# Patient Record
Sex: Female | Born: 1980 | Race: Black or African American | Hispanic: No | Marital: Single | Smoking: Current every day smoker
Health system: Southern US, Community
[De-identification: ages and names within clinical notes are randomized; demographics above are authoritative.]

## PROBLEM LIST (undated history)

## (undated) DIAGNOSIS — E119 Type 2 diabetes mellitus without complications: Secondary | ICD-10-CM

## (undated) DIAGNOSIS — F329 Major depressive disorder, single episode, unspecified: Secondary | ICD-10-CM

## (undated) DIAGNOSIS — F32A Depression, unspecified: Secondary | ICD-10-CM

## (undated) DIAGNOSIS — M7989 Other specified soft tissue disorders: Secondary | ICD-10-CM

## (undated) DIAGNOSIS — J45909 Unspecified asthma, uncomplicated: Secondary | ICD-10-CM

## (undated) DIAGNOSIS — I10 Essential (primary) hypertension: Secondary | ICD-10-CM

---

## 2012-08-10 ENCOUNTER — Encounter (HOSPITAL_COMMUNITY): Payer: Self-pay | Admitting: Emergency Medicine

## 2012-08-10 ENCOUNTER — Emergency Department (HOSPITAL_COMMUNITY)
Admission: EM | Admit: 2012-08-10 | Discharge: 2012-08-10 | Disposition: A | Discharge status: Home / Self Care | Payer: Medicaid Other | Attending: Emergency Medicine | Admitting: Emergency Medicine

## 2012-08-10 DIAGNOSIS — R110 Nausea: Secondary | ICD-10-CM | POA: Insufficient documentation

## 2012-08-10 DIAGNOSIS — E119 Type 2 diabetes mellitus without complications: Secondary | ICD-10-CM | POA: Insufficient documentation

## 2012-08-10 DIAGNOSIS — Z880 Allergy status to penicillin: Secondary | ICD-10-CM | POA: Insufficient documentation

## 2012-08-10 DIAGNOSIS — K921 Melena: Secondary | ICD-10-CM | POA: Insufficient documentation

## 2012-08-10 DIAGNOSIS — F329 Major depressive disorder, single episode, unspecified: Secondary | ICD-10-CM | POA: Insufficient documentation

## 2012-08-10 DIAGNOSIS — Z794 Long term (current) use of insulin: Secondary | ICD-10-CM | POA: Insufficient documentation

## 2012-08-10 DIAGNOSIS — J45909 Unspecified asthma, uncomplicated: Secondary | ICD-10-CM | POA: Insufficient documentation

## 2012-08-10 DIAGNOSIS — R109 Unspecified abdominal pain: Secondary | ICD-10-CM | POA: Insufficient documentation

## 2012-08-10 DIAGNOSIS — I10 Essential (primary) hypertension: Secondary | ICD-10-CM | POA: Insufficient documentation

## 2012-08-10 DIAGNOSIS — Z3202 Encounter for pregnancy test, result negative: Secondary | ICD-10-CM | POA: Insufficient documentation

## 2012-08-10 DIAGNOSIS — Z79899 Other long term (current) drug therapy: Secondary | ICD-10-CM | POA: Insufficient documentation

## 2012-08-10 DIAGNOSIS — F3289 Other specified depressive episodes: Secondary | ICD-10-CM | POA: Insufficient documentation

## 2012-08-10 DIAGNOSIS — K625 Hemorrhage of anus and rectum: Secondary | ICD-10-CM | POA: Insufficient documentation

## 2012-08-10 DIAGNOSIS — F17200 Nicotine dependence, unspecified, uncomplicated: Secondary | ICD-10-CM | POA: Insufficient documentation

## 2012-08-10 DIAGNOSIS — G8929 Other chronic pain: Secondary | ICD-10-CM | POA: Insufficient documentation

## 2012-08-10 HISTORY — DX: Major depressive disorder, single episode, unspecified: F32.9

## 2012-08-10 HISTORY — DX: Unspecified asthma, uncomplicated: J45.909

## 2012-08-10 HISTORY — DX: Type 2 diabetes mellitus without complications: E11.9

## 2012-08-10 HISTORY — DX: Depression, unspecified: F32.A

## 2012-08-10 HISTORY — DX: Essential (primary) hypertension: I10

## 2012-08-10 LAB — URINALYSIS, ROUTINE W REFLEX MICROSCOPIC
Glucose, UA: >1000 mg/dL — AB
Hgb urine dipstick: NEGATIVE
Ketones, ur: NEGATIVE mg/dL
pH: 5.5 (ref 5.0–8.0)

## 2012-08-10 LAB — COMPREHENSIVE METABOLIC PANEL
ALT: 14 U/L (ref 0–35)
AST: 16 U/L (ref 0–37)
Albumin: 3.7 g/dL (ref 3.5–5.2)
CO2: 25 mEq/L (ref 19–32)
Chloride: 97 mEq/L (ref 96–112)
Creatinine, Ser: 0.49 mg/dL — ABNORMAL LOW (ref 0.50–1.10)
GFR calc non Af Amer: >90 mL/min (ref >90)
Sodium: 133 mEq/L — ABNORMAL LOW (ref 135–145)
Total Bilirubin: 0.1 mg/dL — ABNORMAL LOW (ref 0.3–1.2)

## 2012-08-10 LAB — CBC WITH DIFFERENTIAL/PLATELET
Basophils Absolute: 0 10*3/uL (ref 0.0–0.1)
Basophils Relative: 0 % (ref 0–1)
HCT: 35.4 % — ABNORMAL LOW (ref 36.0–46.0)
Lymphocytes Relative: 39 % (ref 12–46)
MCHC: 34.7 g/dL (ref 30.0–36.0)
Monocytes Absolute: 0.7 10*3/uL (ref 0.1–1.0)
Neutro Abs: 5.7 10*3/uL (ref 1.7–7.7)
Neutrophils Relative %: 53 % (ref 43–77)
Platelets: 338 10*3/uL (ref 150–400)
RDW: 12.6 % (ref 11.5–15.5)
WBC: 10.6 10*3/uL — ABNORMAL HIGH (ref 4.0–10.5)

## 2012-08-10 LAB — URINE MICROSCOPIC-ADD ON

## 2012-08-10 LAB — GLUCOSE, CAPILLARY: Glucose-Capillary: 249 mg/dL — ABNORMAL HIGH (ref 70–99)

## 2012-08-10 MED ORDER — PANTOPRAZOLE SODIUM 20 MG PO TBEC: 20.0000 mg | DELAYED_RELEASE_TABLET | Freq: Every day | ORAL | Status: AC

## 2012-08-10 MED ORDER — PANTOPRAZOLE SODIUM 40 MG IV SOLR
40.0000 mg | Freq: Once | INTRAVENOUS | Status: AC
  Administered 2012-08-10: 40 mg via INTRAVENOUS
  Filled 2012-08-10: qty 40

## 2012-08-10 NOTE — ED Notes (Signed)
Pt c/o mid abd pain x months with nausea and some rectal bleeding

## 2012-08-10 NOTE — ED Notes (Signed)
Pt unable to urinate at this time.  

## 2012-08-10 NOTE — ED Provider Notes (Signed)
Patient complains of hypogastric pain for 2 years, daily. She states that everything she eats," goes through me"meaning that she has a bowel movement immediately after eating. She reports blood per rectum daily for the past several years. She reports she's had prior evaluation in [facility] for same complaint. "They could not find a reason" on exam alert nontoxic lungs clear auscultation heart regular rate and rhythm abdomen obese normal active bowel sounds nontender  Doug Sou, MD 08/11/12 0151

## 2012-08-10 NOTE — ED Provider Notes (Signed)
History    CSN: [id number] Arrival date & time 08/10/12  1819  First MD Initiated Contact with Patient 08/10/12 1907     Chief Complaint  Patient presents with  . Abdominal Pain  . Nausea  . Rectal Bleeding   (Consider location/radiation/quality/duration/timing/severity/associated sxs/prior Treatment) HPI Pt is a 32yo female with several year hx of intermittent abdominal pain stating "I can't keep anything down and no one can figure out why." Pt states every time she has eats she has a bowel movement immediately after.  Today she is presenting today with increased abdominal that is constant, aching and throbbing 9/10, associated with nausea and rectal bleeding.  States she noticed bright red blood after sitting on the toilet for 10-42min.  Blood was on stool and tissue paper but states it did not fill the toilet bowl.  Pt has not tried anything for pain.  Pt also reports being diabetic and has difficulty controlling her BG, currently taking novolog, lantus, and januvia.  States she does not regularly check her BG however has been in the 300s and 400s which is why she is now on 3 medications for DM.  Significant abd hx: reports tubal ligation and cyst removal.  Denies fever or vomiting. Last ate around 1600 today.   Past Medical History  Diagnosis Date  . Hypertension   . Diabetes mellitus without complication   . Asthma   . Depression    History reviewed. No pertinent past surgical history. History reviewed. No pertinent family history. History  Substance Use Topics  . Smoking status: Current Every Day Smoker  . Smokeless tobacco: Not on file  . Alcohol Use: Yes     Comment: occ   OB History   Grav Para Term Preterm Abortions TAB SAB Ect Mult Living                 Review of Systems  Constitutional: Negative for fever, chills, fatigue and unexpected weight change.  Gastrointestinal: Positive for nausea, abdominal pain, blood in stool and anal bleeding. Negative for vomiting,  diarrhea, constipation, abdominal distention and rectal pain.  Genitourinary: Negative for dysuria, hematuria and flank pain.  All other systems reviewed and are negative.    Allergies  Shrimp; Ibuprofen; Morphine and related; Penicillins; Strawberry; Tramadol; and Vicodin  Home Medications   Current Outpatient Rx  Name  Route  Sig  Dispense  Refill  . citalopram (CELEXA) 20 MG tablet   Oral   Take 20 mg by mouth daily.         . insulin aspart (NOVOLOG FLEXPEN) 100 UNIT/ML SOPN FlexPen   Subcutaneous   Inject 10 Units into the skin 3 (three) times daily with meals. If CBG is above 200, take additional 10 units-per MD.         . insulin glargine (LANTUS) 100 UNIT/ML injection   Subcutaneous   Inject 80-100 Units into the skin 2 (two) times daily. Takes 80 units in AM, then takes 100 units in PM every day.         . lisinopril-hydrochlorothiazide (PRINZIDE,ZESTORETIC) 20-12.5 MG per tablet   Oral   Take 1 tablet by mouth 2 (two) times daily.         . sertraline (ZOLOFT) 50 MG tablet   Oral   Take 50 mg by mouth daily.         . sitaGLIPtin (JANUVIA) 100 MG tablet   Oral   Take 100 mg by mouth daily.         Marland Kitchen  valACYclovir (VALTREX) 500 MG tablet   Oral   Take 500 mg by mouth daily.         . pantoprazole (PROTONIX) 20 MG tablet   Oral   Take 1 tablet (20 mg total) by mouth daily.   14 tablet   0    BP 123/74  Pulse 83  Temp(Src) 98.7 F (37.1 C) (Oral)  Resp 16  SpO2 99% Physical Exam  Nursing note and vitals reviewed. Constitutional: She appears well-developed and well-nourished. No distress.  HENT:  Head: Normocephalic and atraumatic.  Eyes: Conjunctivae are normal. No scleral icterus.  Neck: Normal range of motion. Neck supple.  Cardiovascular: Normal rate, regular rhythm and normal heart sounds.   Pulmonary/Chest: Effort normal and breath sounds normal. No respiratory distress. She has no wheezes. She has no rales. She exhibits no  tenderness.  Abdominal: Soft. Bowel sounds are normal. She exhibits no distension and no mass. There is tenderness ( mild, diffuse). There is no rebound and no guarding.  Musculoskeletal: Normal range of motion.  Neurological: She is alert.  Skin: Skin is dry. She is not diaphoretic.    ED Course  Procedures (including critical care time) Labs Reviewed  CBC WITH DIFFERENTIAL - Abnormal; Notable for the following:    WBC 10.6 (*)    HCT 35.4 (*)    Lymphs Abs 4.1 (*)    All other components within normal limits  COMPREHENSIVE METABOLIC PANEL - Abnormal; Notable for the following:    Sodium 133 (*)    Glucose, Bld 271 (*)    Creatinine, Ser 0.49 (*)    Total Bilirubin 0.1 (*)    All other components within normal limits  URINALYSIS, ROUTINE W REFLEX MICROSCOPIC - Abnormal; Notable for the following:    APPearance CLOUDY (*)    Specific Gravity, Urine 1.034 (*)    Glucose, UA >1000 (*)    Leukocytes, UA TRACE (*)    All other components within normal limits  GLUCOSE, CAPILLARY - Abnormal; Notable for the following:    Glucose-Capillary 249 (*)    All other components within normal limits  URINE MICROSCOPIC-ADD ON - Abnormal; Notable for the following:    Squamous Epithelial / LPF MANY (*)    Bacteria, UA MANY (*)    All other components within normal limits  LIPASE, BLOOD  OCCULT BLOOD X 1 CARD TO LAB, STOOL  POCT PREGNANCY, URINE  OCCULT BLOOD, POC DEVICE   No results found. 1. Chronic abdominal pain     MDM  DDx: diverticulitis, gastroenteritis, hemorrhoids, IBS   Discussed pt with Dr. Ethelda Chick who does not believe imaging is needed at this time due to chronic nature of pain as well as pt's report of "extensive workup" without answers for patient's pain.    Rx: protonix. Will discharge pt home and have her f/u with [facility] info provided. Pt is also to call [facility] to f/u for ongoing abdominal pain. Return precautions given. Pt  verbalized understanding and agreement with tx plan. Vitals: unremarkable. Discharged in stable condition.    Discussed pt with attending during ED encounter.   Junius Finner, PA-C 08/10/12 2144

## 2012-08-11 NOTE — ED Provider Notes (Signed)
Medical screening examination/treatment/procedure(s) were conducted as a shared visit with non-physician practitioner(s) and myself.  I personally evaluated the patient during the encounter  Doug Sou, MD 08/11/12 [phone]

## 2012-08-12 LAB — URINE CULTURE

## 2012-09-22 ENCOUNTER — Encounter (HOSPITAL_COMMUNITY): Payer: Self-pay

## 2012-09-22 ENCOUNTER — Emergency Department (INDEPENDENT_AMBULATORY_CARE_PROVIDER_SITE_OTHER)
Admission: EM | Admit: 2012-09-22 | Discharge: 2012-09-22 | Disposition: A | Discharge status: Home / Self Care | Payer: Medicaid Other | Source: Home / Self Care | Attending: Family Medicine | Admitting: Family Medicine

## 2012-09-22 DIAGNOSIS — K2950 Unspecified chronic gastritis without bleeding: Secondary | ICD-10-CM

## 2012-09-22 DIAGNOSIS — K2940 Chronic atrophic gastritis without bleeding: Secondary | ICD-10-CM

## 2012-09-22 MED ORDER — RANITIDINE HCL 150 MG PO TABS: 150.0000 mg | ORAL_TABLET | Freq: Two times a day (BID) | ORAL | Status: DC

## 2012-09-22 MED ORDER — GI COCKTAIL ~~LOC~~
ORAL | Status: AC
  Filled 2012-09-22: qty 30

## 2012-09-22 MED ORDER — GI COCKTAIL ~~LOC~~
30.0000 mL | Freq: Once | ORAL | Status: AC
  Administered 2012-09-22: 30 mL via ORAL

## 2012-09-22 MED ORDER — ONDANSETRON HCL 4 MG PO TABS: 4.0000 mg | ORAL_TABLET | Freq: Four times a day (QID) | ORAL | Status: DC

## 2012-09-22 NOTE — ED Notes (Signed)
MD eval only for GERD type syx

## 2012-09-22 NOTE — ED Provider Notes (Signed)
CSN: [id number]     Arrival date & time 09/22/12  1850 History     First MD Initiated Contact with Patient 09/22/12 1949     No chief complaint on file.  (Consider location/radiation/quality/duration/timing/severity/associated sxs/prior Treatment) Patient is a 32 y.o. female presenting with abdominal pain. The history is provided by the patient.  Abdominal Pain This is a chronic problem. Episode onset: long time. The problem has not changed since onset.Associated symptoms include abdominal pain. Associated symptoms comments: Admits to lot of stress..    Past Medical History  Diagnosis Date  . Hypertension   . Diabetes mellitus without complication   . Asthma   . Depression    No past surgical history on file. No family history on file. History  Substance Use Topics  . Smoking status: Current Every Day Smoker  . Smokeless tobacco: Not on file  . Alcohol Use: Yes     Comment: occ   OB History   Grav Para Term Preterm Abortions TAB SAB Ect Mult Living                 Review of Systems  Constitutional: Positive for appetite change. Negative for fever and chills.  Gastrointestinal: Positive for nausea and abdominal pain. Negative for vomiting, diarrhea and constipation.  Genitourinary: Negative.  Negative for urgency, menstrual problem and pelvic pain.    Allergies  Shrimp; Ibuprofen; Morphine and related; Penicillins; Strawberry; Tramadol; and Vicodin  Home Medications   Current Outpatient Rx  Name  Route  Sig  Dispense  Refill  . citalopram (CELEXA) 20 MG tablet   Oral   Take 20 mg by mouth daily.         . insulin aspart (NOVOLOG FLEXPEN) 100 UNIT/ML SOPN FlexPen   Subcutaneous   Inject 10 Units into the skin 3 (three) times daily with meals. If CBG is above 200, take additional 10 units-per MD.         . insulin glargine (LANTUS) 100 UNIT/ML injection   Subcutaneous   Inject 80-100 Units into the skin 2 (two) times daily. Takes 80 units in AM, then takes  100 units in PM every day.         . lisinopril-hydrochlorothiazide (PRINZIDE,ZESTORETIC) 20-12.5 MG per tablet   Oral   Take 1 tablet by mouth 2 (two) times daily.         . ondansetron (ZOFRAN) 4 MG tablet   Oral   Take 1 tablet (4 mg total) by mouth every 6 (six) hours. As needed for n/v   12 tablet   0   . pantoprazole (PROTONIX) 20 MG tablet   Oral   Take 1 tablet (20 mg total) by mouth daily.   14 tablet   0   . ranitidine (ZANTAC) 150 MG tablet   Oral   Take 1 tablet (150 mg total) by mouth 2 (two) times daily.   60 tablet   0   . sertraline (ZOLOFT) 50 MG tablet   Oral   Take 50 mg by mouth daily.         . sitaGLIPtin (JANUVIA) 100 MG tablet   Oral   Take 100 mg by mouth daily.         . valACYclovir (VALTREX) 500 MG tablet   Oral   Take 500 mg by mouth daily.          BP 145/84  Pulse 95  Temp(Src) 98.2 F (36.8 C) (Oral)  Resp 16  SpO2 100% Physical  Exam  Nursing note and vitals reviewed. Constitutional: She is oriented to person, place, and time. She appears well-developed and well-nourished.  Neck: Neck supple.  Abdominal: Soft. Bowel sounds are normal. She exhibits no distension and no mass. There is no tenderness. There is no rebound and no guarding.  Neurological: She is alert and oriented to person, place, and time.  Skin: Skin is warm and dry.    ED Course   Procedures (including critical care time)  Labs Reviewed - No data to display No results found. 1. Gastritis, chronic     MDM    Linna Hoff, MD 09/22/12 2007

## 2012-10-04 ENCOUNTER — Encounter (HOSPITAL_COMMUNITY): Payer: Self-pay | Admitting: *Deleted

## 2012-10-04 ENCOUNTER — Emergency Department (HOSPITAL_COMMUNITY)
Admission: EM | Admit: 2012-10-04 | Discharge: 2012-10-04 | Disposition: A | Discharge status: Home / Self Care | Payer: Medicaid Other | Attending: Emergency Medicine | Admitting: Emergency Medicine

## 2012-10-04 DIAGNOSIS — Z794 Long term (current) use of insulin: Secondary | ICD-10-CM | POA: Insufficient documentation

## 2012-10-04 DIAGNOSIS — F3289 Other specified depressive episodes: Secondary | ICD-10-CM | POA: Insufficient documentation

## 2012-10-04 DIAGNOSIS — M7989 Other specified soft tissue disorders: Secondary | ICD-10-CM | POA: Insufficient documentation

## 2012-10-04 DIAGNOSIS — G8929 Other chronic pain: Secondary | ICD-10-CM

## 2012-10-04 DIAGNOSIS — J45909 Unspecified asthma, uncomplicated: Secondary | ICD-10-CM | POA: Insufficient documentation

## 2012-10-04 DIAGNOSIS — Z880 Allergy status to penicillin: Secondary | ICD-10-CM | POA: Insufficient documentation

## 2012-10-04 DIAGNOSIS — R109 Unspecified abdominal pain: Secondary | ICD-10-CM | POA: Insufficient documentation

## 2012-10-04 DIAGNOSIS — I10 Essential (primary) hypertension: Secondary | ICD-10-CM | POA: Insufficient documentation

## 2012-10-04 DIAGNOSIS — R197 Diarrhea, unspecified: Secondary | ICD-10-CM | POA: Insufficient documentation

## 2012-10-04 DIAGNOSIS — R509 Fever, unspecified: Secondary | ICD-10-CM | POA: Insufficient documentation

## 2012-10-04 DIAGNOSIS — R220 Localized swelling, mass and lump, head: Secondary | ICD-10-CM | POA: Insufficient documentation

## 2012-10-04 DIAGNOSIS — E119 Type 2 diabetes mellitus without complications: Secondary | ICD-10-CM | POA: Insufficient documentation

## 2012-10-04 DIAGNOSIS — F329 Major depressive disorder, single episode, unspecified: Secondary | ICD-10-CM | POA: Insufficient documentation

## 2012-10-04 DIAGNOSIS — Z3202 Encounter for pregnancy test, result negative: Secondary | ICD-10-CM | POA: Insufficient documentation

## 2012-10-04 DIAGNOSIS — Z79899 Other long term (current) drug therapy: Secondary | ICD-10-CM | POA: Insufficient documentation

## 2012-10-04 DIAGNOSIS — F17200 Nicotine dependence, unspecified, uncomplicated: Secondary | ICD-10-CM | POA: Insufficient documentation

## 2012-10-04 LAB — CBC WITH DIFFERENTIAL/PLATELET
Basophils Absolute: 0 K/uL (ref 0.0–0.1)
Basophils Relative: 0 % (ref 0–1)
Eosinophils Absolute: 0.2 K/uL (ref 0.0–0.7)
Eosinophils Relative: 3 % (ref 0–5)
HCT: 35.8 % — ABNORMAL LOW (ref 36.0–46.0)
Hemoglobin: 12.6 g/dL (ref 12.0–15.0)
Lymphocytes Relative: 39 % (ref 12–46)
Lymphs Abs: 3.6 K/uL (ref 0.7–4.0)
MCH: 28.8 pg (ref 26.0–34.0)
MCHC: 35.2 g/dL (ref 30.0–36.0)
MCV: 81.7 fL (ref 78.0–100.0)
Monocytes Absolute: 0.6 K/uL (ref 0.1–1.0)
Monocytes Relative: 7 % (ref 3–12)
Neutro Abs: 4.7 K/uL (ref 1.7–7.7)
Neutrophils Relative %: 51 % (ref 43–77)
Platelets: 309 K/uL (ref 150–400)
RBC: 4.38 MIL/uL (ref 3.87–5.11)
RDW: 12.9 % (ref 11.5–15.5)
WBC: 9.2 K/uL (ref 4.0–10.5)

## 2012-10-04 LAB — COMPREHENSIVE METABOLIC PANEL
AST: 25 U/L (ref 0–37)
Albumin: 3.9 g/dL (ref 3.5–5.2)
GFR calc non Af Amer: >90 mL/min (ref >90)
Glucose, Bld: 372 mg/dL — ABNORMAL HIGH (ref 70–99)
Potassium: 4 mEq/L (ref 3.5–5.1)
Sodium: 131 mEq/L — ABNORMAL LOW (ref 135–145)
Total Bilirubin: 0.3 mg/dL (ref 0.3–1.2)
Total Protein: 7.8 g/dL (ref 6.0–8.3)

## 2012-10-04 LAB — URINALYSIS, ROUTINE W REFLEX MICROSCOPIC
Ketones, ur: NEGATIVE mg/dL
Nitrite: NEGATIVE
Protein, ur: NEGATIVE mg/dL

## 2012-10-04 LAB — POCT I-STAT TROPONIN I: Troponin i, poc: 0.01 ng/mL (ref 0.00–0.08)

## 2012-10-04 LAB — LIPASE, BLOOD: Lipase: 27 U/L (ref 11–59)

## 2012-10-04 MED ORDER — PROMETHAZINE HCL 25 MG PO TABS: 25.0000 mg | ORAL_TABLET | Freq: Four times a day (QID) | ORAL | Status: DC | PRN

## 2012-10-04 MED ORDER — OXYCODONE-ACETAMINOPHEN 5-325 MG PO TABS: ORAL_TABLET | ORAL | Status: DC

## 2012-10-04 MED ORDER — ONDANSETRON 4 MG PO TBDP
4.0000 mg | ORAL_TABLET | Freq: Once | ORAL | Status: AC
  Administered 2012-10-04: 4 mg via ORAL
  Filled 2012-10-04: qty 1

## 2012-10-04 MED ORDER — OXYCODONE-ACETAMINOPHEN 5-325 MG PO TABS
1.0000 | ORAL_TABLET | Freq: Once | ORAL | Status: AC
  Administered 2012-10-04: 1 via ORAL
  Filled 2012-10-04: qty 1

## 2012-10-04 NOTE — ED Notes (Signed)
Pt ambulated to the restroom with out difficulty.

## 2012-10-04 NOTE — ED Notes (Signed)
BIB [facility].  Severe abdominal pain. N/v xweek. PT complains of night sweats and increasing lower extremity edema. CBG 330.

## 2012-10-04 NOTE — ED Notes (Signed)
Pt on phone talking when RN walks into the room.

## 2012-10-04 NOTE — ED Notes (Signed)
Pt reports "my nose, back of feet, and legs are swelling up.  I'm having a hard time breathing, I'm dehydrated, Im throwing up, I'm always hot and sweating.  I have this pain in my stomach up top."

## 2012-10-04 NOTE — ED Notes (Signed)
Pt talking on phone when RN enters room. Pt appears comfortable in bed.  No S/S of nausea or vomiting after drinking water.  Tolerated well.

## 2012-10-04 NOTE — ED Provider Notes (Signed)
CSN: [id number]     Arrival date & time 10/04/12  1518 History   First MD Initiated Contact with Patient 10/04/12 1554     Chief Complaint  Patient presents with  . Abdominal Pain   (Consider location/radiation/quality/duration/timing/severity/associated sxs/prior Treatment) HPI  Calen Posch is a 32 y.o. female complaining of epigastric abd pain x2 years described as tight 10/10. No pain medication PTA because "nothing helps." Subjective fever x1 month. Patient reports bilateral foot, left calf swelling, abdominal and nose is swelling x7days. Patient denies cough, shortness of breath she reports single episode of nonbloody, nonbilious emesis today. She has chronic diarrhea since if she has 10 bowel movements per day. It is non-melanotic, no hematochezia. Patient has been evaluated by GI in the past had a negative workup.   Past Medical History  Diagnosis Date  . Hypertension   . Diabetes mellitus without complication   . Asthma   . Depression    History reviewed. No pertinent past surgical history. No family history on file. History  Substance Use Topics  . Smoking status: Current Every Day Smoker  . Smokeless tobacco: Not on file  . Alcohol Use: Yes     Comment: occ   OB History   Grav Para Term Preterm Abortions TAB SAB Ect Mult Living                 Review of Systems 10 systems reviewed and found to be negative, except as noted in the HPI  Allergies  Shrimp; Aspirin; Ibuprofen; Morphine and related; Penicillins; Strawberry; Tramadol; and Vicodin  Home Medications   Current Outpatient Rx  Name  Route  Sig  Dispense  Refill  . citalopram (CELEXA) 20 MG tablet   Oral   Take 20 mg by mouth daily.         . insulin aspart (NOVOLOG FLEXPEN) 100 UNIT/ML SOPN FlexPen   Subcutaneous   Inject 20 Units into the skin 3 (three) times daily with meals. If CBG is above 200, take additional 10 units-per MD.         . insulin glargine (LANTUS) 100 UNIT/ML injection    Subcutaneous   Inject 80-100 Units into the skin 2 (two) times daily. Takes 80 units in AM, then takes 100 units in PM every day.         . lisinopril-hydrochlorothiazide (PRINZIDE,ZESTORETIC) 20-12.5 MG per tablet   Oral   Take 1 tablet by mouth 2 (two) times daily.         . ondansetron (ZOFRAN) 4 MG tablet   Oral   Take 1 tablet (4 mg total) by mouth every 6 (six) hours. As needed for n/v   12 tablet   0   . pantoprazole (PROTONIX) 20 MG tablet   Oral   Take 1 tablet (20 mg total) by mouth daily.   14 tablet   0   . ranitidine (ZANTAC) 150 MG tablet   Oral   Take 1 tablet (150 mg total) by mouth 2 (two) times daily.   60 tablet   0   . sertraline (ZOLOFT) 50 MG tablet   Oral   Take 50 mg by mouth daily.         . sitaGLIPtin (JANUVIA) 100 MG tablet   Oral   Take 100 mg by mouth daily.         . valACYclovir (VALTREX) 500 MG tablet   Oral   Take 500 mg by mouth daily.  BP 129/81  Pulse 100  Temp(Src) 98.1 F (36.7 C) (Oral)  Resp 18  SpO2 97% Physical Exam  Nursing note and vitals reviewed. Constitutional: She is oriented to person, place, and time. She appears well-developed and well-nourished. No distress.  HENT:  Head: Normocephalic.  Mouth/Throat: Oropharynx is clear and moist.  Patient has a pimple on her nose  Eyes: Conjunctivae and EOM are normal. Pupils are equal, round, and reactive to light.  Neck: Normal range of motion.  Cardiovascular: Normal rate, regular rhythm and intact distal pulses.   Pulmonary/Chest: Effort normal and breath sounds normal. No stridor. No respiratory distress. She has no wheezes. She has no rales. She exhibits no tenderness.  Abdominal: Soft. Bowel sounds are normal. She exhibits no mass. There is no tenderness. There is no rebound and no guarding.  Obese protuberant, normal active bowel sounds, negative fluid wave, not tense. No tenderness to palpation of any quadrant, no guarding or rebound    Musculoskeletal: Normal range of motion. She exhibits no edema.  No edema to bilateral lower extremities  Lymphadenopathy:    She has no cervical adenopathy.  Neurological: She is alert and oriented to person, place, and time.  Psychiatric: She has a normal mood and affect.    ED Course  Procedures (including critical care time) Labs Review Labs Reviewed  CBC WITH DIFFERENTIAL - Abnormal; Notable for the following:    HCT 35.8 (*)    All other components within normal limits  COMPREHENSIVE METABOLIC PANEL - Abnormal; Notable for the following:    Sodium 131 (*)    Glucose, Bld 372 (*)    All other components within normal limits  URINALYSIS, ROUTINE W REFLEX MICROSCOPIC - Abnormal; Notable for the following:    Specific Gravity, Urine 1.031 (*)    Glucose, UA >1000 (*)    Leukocytes, UA SMALL (*)    All other components within normal limits  URINE MICROSCOPIC-ADD ON - Abnormal; Notable for the following:    Squamous Epithelial / LPF FEW (*)    Bacteria, UA FEW (*)    All other components within normal limits  GLUCOSE, CAPILLARY - Abnormal; Notable for the following:    Glucose-Capillary 293 (*)    All other components within normal limits  URINE CULTURE  LIPASE, BLOOD  POCT I-STAT TROPONIN I  POCT PREGNANCY, URINE   Imaging Review No results found.   Date: 10/04/2012  Rate: 101  Rhythm: Sinus tach  QRS Axis: normal  Intervals: normal  ST/T Wave abnormalities: normal  Conduction Disutrbances:none  Narrative Interpretation: LVH  Old EKG Reviewed: unchanged   MDM   1. Chronic abdominal pain     Filed Vitals:   10/04/12 1524  BP: 129/81  Pulse: 100  Temp: 98.1 F (36.7 C)  TempSrc: Oral  Resp: 18  SpO2: 97%     Donnamaria Shands is a 32 y.o. female multiple complaints. Normal vital signs, normal physical exam. EKG is normal except for LVH. Passed by mouth challenge. Patient has elevated glucose at 293 with no anion gap. Otherwise blood work shows no  abnormalities. Urinalysis is not consistent with infection but does show a poorly controlled diabetes. Patient states her glucose is usually very hard to control. I doubt there is any acute process that would require emergent intervention at this time. We have discussed the importance of strict glucose control and possibility of developing pain diabetic gastroparesis.  Medications  oxyCODONE-acetaminophen (PERCOCET/ROXICET) 5-325 MG per tablet 1 tablet (1 tablet Oral Given  10/04/12 1635)  ondansetron (ZOFRAN-ODT) disintegrating tablet 4 mg (4 mg Oral Given 10/04/12 1635)    Pt is hemodynamically stable, appropriate for, and amenable to discharge at this time. Pt verbalized understanding and agrees with care plan. All questions answered. Outpatient follow-up and specific return precautions discussed.    Discharge Medication List as of 10/04/2012  6:23 PM    START taking these medications   Details  oxyCODONE-acetaminophen (PERCOCET/ROXICET) 5-325 MG per tablet 1 to 2 tabs PO q6hrs  PRN for pain, Print    promethazine (PHENERGAN) 25 MG tablet Take 1 tablet (25 mg total) by mouth every 6 (six) hours as needed for nausea., Starting 10/04/2012, Until Discontinued, Print        Note: Portions of this report may have been transcribed using voice recognition software. Every effort was made to ensure accuracy; however, inadvertent computerized transcription errors may be present     Wynetta Emery, PA-C 10/07/12 0730

## 2012-10-04 NOTE — ED Notes (Signed)
Pt is here with upper abdominal pain and tightness to upper abdomen and seen here 2 weeks told it was stress.  Pt states both feet swelling.  Pt is having swelling to back of left calf.  Pt reports nose swollen too.  Pulses present to bilateral feet

## 2012-10-04 NOTE — ED Notes (Signed)
Pt provided with water for PO challenge.

## 2012-10-06 LAB — URINE CULTURE: Colony Count: >=100000

## 2012-10-08 NOTE — ED Provider Notes (Signed)
Medical screening examination/treatment/procedure(s) were performed by non-physician practitioner and as supervising physician I was immediately available for consultation/collaboration. Devoria Albe, MD, FACEP   Ward Givens, MD 10/08/12 [phone]

## 2012-10-11 ENCOUNTER — Emergency Department (HOSPITAL_COMMUNITY)
Admission: EM | Admit: 2012-10-11 | Discharge: 2012-10-11 | Disposition: A | Discharge status: Home / Self Care | Payer: Medicaid Other | Attending: Emergency Medicine | Admitting: Emergency Medicine

## 2012-10-11 ENCOUNTER — Encounter (HOSPITAL_COMMUNITY): Payer: Self-pay | Admitting: Emergency Medicine

## 2012-10-11 DIAGNOSIS — R51 Headache: Secondary | ICD-10-CM | POA: Insufficient documentation

## 2012-10-11 DIAGNOSIS — F329 Major depressive disorder, single episode, unspecified: Secondary | ICD-10-CM | POA: Insufficient documentation

## 2012-10-11 DIAGNOSIS — F3289 Other specified depressive episodes: Secondary | ICD-10-CM | POA: Insufficient documentation

## 2012-10-11 DIAGNOSIS — Z794 Long term (current) use of insulin: Secondary | ICD-10-CM | POA: Insufficient documentation

## 2012-10-11 DIAGNOSIS — I10 Essential (primary) hypertension: Secondary | ICD-10-CM | POA: Insufficient documentation

## 2012-10-11 DIAGNOSIS — H1045 Other chronic allergic conjunctivitis: Secondary | ICD-10-CM | POA: Insufficient documentation

## 2012-10-11 DIAGNOSIS — H5789 Other specified disorders of eye and adnexa: Secondary | ICD-10-CM | POA: Insufficient documentation

## 2012-10-11 DIAGNOSIS — E119 Type 2 diabetes mellitus without complications: Secondary | ICD-10-CM | POA: Insufficient documentation

## 2012-10-11 DIAGNOSIS — Z79899 Other long term (current) drug therapy: Secondary | ICD-10-CM | POA: Insufficient documentation

## 2012-10-11 DIAGNOSIS — J45909 Unspecified asthma, uncomplicated: Secondary | ICD-10-CM | POA: Insufficient documentation

## 2012-10-11 DIAGNOSIS — Z880 Allergy status to penicillin: Secondary | ICD-10-CM | POA: Insufficient documentation

## 2012-10-11 DIAGNOSIS — F17200 Nicotine dependence, unspecified, uncomplicated: Secondary | ICD-10-CM | POA: Insufficient documentation

## 2012-10-11 DIAGNOSIS — H579 Unspecified disorder of eye and adnexa: Secondary | ICD-10-CM | POA: Insufficient documentation

## 2012-10-11 DIAGNOSIS — H1011 Acute atopic conjunctivitis, right eye: Secondary | ICD-10-CM

## 2012-10-11 MED ORDER — LORATADINE 10 MG PO TABS
10.0000 mg | ORAL_TABLET | Freq: Every day | ORAL | Status: DC
  Administered 2012-10-11: 10 mg via ORAL
  Filled 2012-10-11: qty 1

## 2012-10-11 MED ORDER — KETOROLAC TROMETHAMINE 60 MG/2ML IM SOLN
60.0000 mg | Freq: Once | INTRAMUSCULAR | Status: AC
  Administered 2012-10-11: 60 mg via INTRAMUSCULAR
  Filled 2012-10-11: qty 2

## 2012-10-11 MED ORDER — NAPHAZOLINE-PHENIRAMINE 0.025-0.3 % OP SOLN: 1.0000 [drp] | Freq: Four times a day (QID) | OPHTHALMIC | Status: DC | PRN

## 2012-10-11 MED ORDER — TETRACAINE HCL 0.5 % OP SOLN
1.0000 [drp] | Freq: Once | OPHTHALMIC | Status: AC
  Administered 2012-10-11: 1 [drp] via OPHTHALMIC
  Filled 2012-10-11: qty 2

## 2012-10-11 MED ORDER — FLUORESCEIN SODIUM 1 MG OP STRP
1.0000 | ORAL_STRIP | Freq: Once | OPHTHALMIC | Status: AC
  Administered 2012-10-11: 1 via OPHTHALMIC
  Filled 2012-10-11: qty 1

## 2012-10-11 NOTE — ED Notes (Signed)
Pt c/o r side facial swelling and pain x2 weeks.  Pt reports she was seen at [facility] last week and since then pain and swelling has increased.

## 2012-10-11 NOTE — ED Provider Notes (Signed)
CSN: [id number]     Arrival date & time 10/11/12  1452 History   First MD Initiated Contact with Patient 10/11/12 1535     Chief Complaint  Patient presents with  . Facial Swelling  . Facial Pain   (Consider location/radiation/quality/duration/timing/severity/associated sxs/prior Treatment) HPI Comments: Patient is a 32 year old female past medical history significant for hypertension, DM, asthma, depression, medication noncompliance presented to emergency department for 2 weeks of right-sided facial swelling and and severe throbbing right sided headache. Patient states she feels as though her swelling is getting worse. Patient states that she has had some right eye itching and began the time of her facial swelling. She denies any eye pain, photophobia, purulent drainage. Patient has not tried anything to help alleviate pain. Denies any aggravating factors. Patient is non-complaint with her prescribed medications. Not a contact lens wearer.    Past Medical History  Diagnosis Date  . Hypertension   . Diabetes mellitus without complication   . Asthma   . Depression    History reviewed. No pertinent past surgical history. History reviewed. No pertinent family history. History  Substance Use Topics  . Smoking status: Current Every Day Smoker  . Smokeless tobacco: Not on file  . Alcohol Use: Yes     Comment: occ   OB History   Grav Para Term Preterm Abortions TAB SAB Ect Mult Living                 Review of Systems  Constitutional: Negative for fever and chills.  HENT: Positive for facial swelling. Negative for neck pain and neck stiffness.   Eyes: Positive for redness and itching. Negative for photophobia and visual disturbance.  Respiratory: Negative for shortness of breath.   Cardiovascular: Negative for chest pain.  Gastrointestinal: Negative for nausea, vomiting and abdominal pain.  Genitourinary: Negative.   Musculoskeletal: Negative for back pain.  Skin: Negative.    Neurological: Positive for headaches.    Allergies  Shrimp; Aspirin; Ibuprofen; Morphine and related; Penicillins; Strawberry; Tramadol; and Vicodin  Home Medications   Current Outpatient Rx  Name  Route  Sig  Dispense  Refill  . citalopram (CELEXA) 20 MG tablet   Oral   Take 20 mg by mouth daily.         . insulin aspart (NOVOLOG FLEXPEN) 100 UNIT/ML SOPN FlexPen   Subcutaneous   Inject 20 Units into the skin 3 (three) times daily with meals. If CBG is above 200, take additional 10 units-per MD.         . insulin glargine (LANTUS) 100 UNIT/ML injection   Subcutaneous   Inject 80-100 Units into the skin 2 (two) times daily. Takes 80 units in AM, then takes 100 units in PM every day.         . lisinopril-hydrochlorothiazide (PRINZIDE,ZESTORETIC) 20-12.5 MG per tablet   Oral   Take 1 tablet by mouth 2 (two) times daily.         . pantoprazole (PROTONIX) 20 MG tablet   Oral   Take 1 tablet (20 mg total) by mouth daily.   14 tablet   0   . sertraline (ZOLOFT) 50 MG tablet   Oral   Take 50 mg by mouth daily.         . sitaGLIPtin (JANUVIA) 100 MG tablet   Oral   Take 100 mg by mouth daily.         . valACYclovir (VALTREX) 500 MG tablet   Oral   Take 500  mg by mouth daily.         . naphazoline-pheniramine (NAPHCON-A) 0.025-0.3 % ophthalmic solution   Right Eye   Place 1 drop into the right eye every 6 (six) hours as needed.   15 mL   0   . ondansetron (ZOFRAN) 4 MG tablet   Oral   Take 1 tablet (4 mg total) by mouth every 6 (six) hours. As needed for n/v   12 tablet   0   . oxyCODONE-acetaminophen (PERCOCET/ROXICET) 5-325 MG per tablet      1 to 2 tabs PO q6hrs  PRN for pain   4 tablet   0   . promethazine (PHENERGAN) 25 MG tablet   Oral   Take 1 tablet (25 mg total) by mouth every 6 (six) hours as needed for nausea.   12 tablet   0   . ranitidine (ZANTAC) 150 MG tablet   Oral   Take 1 tablet (150 mg total) by mouth 2 (two) times  daily.   60 tablet   0    BP 132/77  Pulse 95  Temp(Src) 98.9 F (37.2 C) (Oral)  Resp 19  SpO2 98%  LMP 10/08/2012 Physical Exam  Constitutional: She is oriented to person, place, and time. She appears well-developed and well-nourished. No distress.  HENT:  Head: Normocephalic and atraumatic.  Right Ear: External ear normal.  Left Ear: External ear normal.  Nose: Nose normal.  Mouth/Throat: Oropharynx is clear and moist. No oropharyngeal exudate.  No swelling appreciated.   Eyes: Conjunctivae, EOM and lids are normal. Pupils are equal, round, and reactive to light. No foreign body present in the right eye. No foreign body present in the left eye.  Fundoscopic exam:      The right eye shows no hemorrhage and no papilledema. The right eye shows red reflex.       The left eye shows no hemorrhage and no papilledema. The left eye shows red reflex.  Slit lamp exam:      The right eye shows no corneal abrasion, no corneal flare, no corneal ulcer, no foreign body, no hyphema and no fluorescein uptake.  No corneal lesions. No dendritic ulcer. OD: 20/30 OS: 20/30 Bilateral: 20/30.   Neck: Normal range of motion. Neck supple.  Cardiovascular: Normal rate, regular rhythm and normal heart sounds.   Pulmonary/Chest: Effort normal and breath sounds normal. No respiratory distress.  Abdominal: Soft. There is no tenderness.  Musculoskeletal: Normal range of motion.  Neurological: She is alert and oriented to person, place, and time.  Skin: Skin is warm and dry. She is not diaphoretic.    ED Course  Procedures (including critical care time) Labs Review Labs Reviewed - No data to display Imaging Review No results found.  MDM   1. Allergic conjunctivitis, right    Afebrile, NAD, non-toxic appearing, AAOx4. No facial swelling appreciated. No signs of respiratory distress. Pt dx likely viral conjunctivitis based on presentation & eye exam, no antibiotics are indicated.  No evidence of  HSV or VSV infection. Pt is not a contact lens wearer.  Exam non-concerning for orbital cellulitis, hyphema, corneal ulcers, corneal abrasions or trauma.  Patient will be discharged home with naphazoline drops every 1 to 2 hours for pruritus.  Patient has been instructed to use cool compresses and practice personal hygiene with frequent hand washing.  Patient understands to follow up with ophthalmology, especially if new symptoms including change in vision, purulent drainage, or entrapment occur.  Patient is  agreeable to plan. Patient d/w with Dr. Oletta Lamas, agrees with plan. Patient is stable at time of discharge         Jeannetta Ellis, PA-C 10/12/12 1610

## 2012-10-12 NOTE — ED Provider Notes (Signed)
Medical screening examination/treatment/procedure(s) were performed by non-physician practitioner and as supervising physician I was immediately available for consultation/collaboration.  Kei Mcelhiney Y. Delaney Perona, MD 10/12/12 1455 

## 2012-10-15 ENCOUNTER — Emergency Department (HOSPITAL_COMMUNITY): Payer: Medicaid Other

## 2012-10-15 ENCOUNTER — Encounter (HOSPITAL_COMMUNITY): Payer: Self-pay

## 2012-10-15 ENCOUNTER — Emergency Department (HOSPITAL_COMMUNITY)
Admission: EM | Admit: 2012-10-15 | Discharge: 2012-10-15 | Disposition: A | Discharge status: Home / Self Care | Payer: Medicaid Other | Attending: Emergency Medicine | Admitting: Emergency Medicine

## 2012-10-15 DIAGNOSIS — I10 Essential (primary) hypertension: Secondary | ICD-10-CM | POA: Insufficient documentation

## 2012-10-15 DIAGNOSIS — J45901 Unspecified asthma with (acute) exacerbation: Secondary | ICD-10-CM | POA: Insufficient documentation

## 2012-10-15 DIAGNOSIS — F17200 Nicotine dependence, unspecified, uncomplicated: Secondary | ICD-10-CM | POA: Insufficient documentation

## 2012-10-15 DIAGNOSIS — Z79899 Other long term (current) drug therapy: Secondary | ICD-10-CM | POA: Insufficient documentation

## 2012-10-15 DIAGNOSIS — R109 Unspecified abdominal pain: Secondary | ICD-10-CM | POA: Insufficient documentation

## 2012-10-15 DIAGNOSIS — Z880 Allergy status to penicillin: Secondary | ICD-10-CM | POA: Insufficient documentation

## 2012-10-15 DIAGNOSIS — F3289 Other specified depressive episodes: Secondary | ICD-10-CM | POA: Insufficient documentation

## 2012-10-15 DIAGNOSIS — R220 Localized swelling, mass and lump, head: Secondary | ICD-10-CM | POA: Insufficient documentation

## 2012-10-15 DIAGNOSIS — J3489 Other specified disorders of nose and nasal sinuses: Secondary | ICD-10-CM | POA: Insufficient documentation

## 2012-10-15 DIAGNOSIS — E119 Type 2 diabetes mellitus without complications: Secondary | ICD-10-CM | POA: Insufficient documentation

## 2012-10-15 DIAGNOSIS — M7989 Other specified soft tissue disorders: Secondary | ICD-10-CM | POA: Insufficient documentation

## 2012-10-15 DIAGNOSIS — R51 Headache: Secondary | ICD-10-CM | POA: Insufficient documentation

## 2012-10-15 DIAGNOSIS — Z794 Long term (current) use of insulin: Secondary | ICD-10-CM | POA: Insufficient documentation

## 2012-10-15 DIAGNOSIS — R0789 Other chest pain: Secondary | ICD-10-CM | POA: Insufficient documentation

## 2012-10-15 DIAGNOSIS — F329 Major depressive disorder, single episode, unspecified: Secondary | ICD-10-CM | POA: Insufficient documentation

## 2012-10-15 HISTORY — DX: Other specified soft tissue disorders: M79.89

## 2012-10-15 LAB — COMPREHENSIVE METABOLIC PANEL
AST: 24 U/L (ref 0–37)
CO2: 25 mEq/L (ref 19–32)
Calcium: 9.4 mg/dL (ref 8.4–10.5)
Creatinine, Ser: 0.51 mg/dL (ref 0.50–1.10)
GFR calc non Af Amer: >90 mL/min (ref >90)
Sodium: 135 mEq/L (ref 135–145)
Total Protein: 7.5 g/dL (ref 6.0–8.3)

## 2012-10-15 LAB — D-DIMER, QUANTITATIVE: D-Dimer, Quant: <0.27 ug/mL-FEU (ref 0.00–0.48)

## 2012-10-15 LAB — CBC
MCH: 28.3 pg (ref 26.0–34.0)
MCHC: 34.5 g/dL (ref 30.0–36.0)
MCV: 82 fL (ref 78.0–100.0)
Platelets: 323 10*3/uL (ref 150–400)
RBC: 4.45 MIL/uL (ref 3.87–5.11)
RDW: 12.6 % (ref 11.5–15.5)

## 2012-10-15 LAB — POCT I-STAT TROPONIN I: Troponin i, poc: 0.02 ng/mL (ref 0.00–0.08)

## 2012-10-15 MED ORDER — ACETAMINOPHEN 500 MG PO TABS
1000.0000 mg | ORAL_TABLET | Freq: Once | ORAL | Status: AC
  Administered 2012-10-15: 1000 mg via ORAL
  Filled 2012-10-15: qty 2

## 2012-10-15 NOTE — ED Notes (Signed)
Pt states she takes fluid pills for 2 weeks.  Pt states she doesn't think they are working.  States she still has swelling in legs and has increased shortness of breath.  Pt states she does not know why she has swelling just that MD put her on the pills for it.  Also c/o headache.

## 2012-10-15 NOTE — ED Provider Notes (Signed)
CSN: [id number]     Arrival date & time 10/15/12  1955 History   First MD Initiated Contact with Patient 10/15/12 2109     Chief Complaint  Patient presents with  . Leg Swelling  . Shortness of Breath   (Consider location/radiation/quality/duration/timing/severity/associated sxs/prior Treatment) HPI Comments: 32 year old female presents with multiple complaints. States she's having shortness of breath and leg swelling bilaterally for the past several months. She states she's been taking "fluid pills" which she does not know the name of for her PCP. She states that today the leg swelling and shortness of breath worsened and she was having a hard time putting on her shoes. She states the shortness of breath is worse when she is walking. This again has been for several months. She also states she has chest pain which she states has also been going on for "a long time". She has not had any recent travel, surgery, hemoptysis and is not on any birth control. She states the leg swelling is bilateral and symmetric. She also states she's been having a headache for at least 2 weeks if not longer. She tried Tylenol and Motrin without relief. States she still feels like her face is swollen like the last time she presented to the ED. She denies any fevers or chills. Denies any vomiting. No cough.  Patient is a 32 y.o. female presenting with shortness of breath. The history is provided by the patient.  Shortness of Breath Associated symptoms: abdominal pain, chest pain and headaches   Associated symptoms: no cough, no fever, no vomiting and no wheezing     Past Medical History  Diagnosis Date  . Hypertension   . Diabetes mellitus without complication   . Asthma   . Depression   . Leg swelling    History reviewed. No pertinent past surgical history. History reviewed. No pertinent family history. History  Substance Use Topics  . Smoking status: Current Every Day Smoker  . Smokeless tobacco: Not on file   . Alcohol Use: Yes     Comment: occ   OB History   Grav Para Term Preterm Abortions TAB SAB Ect Mult Living                 Review of Systems  Constitutional: Negative for fever and chills.  HENT: Positive for congestion and facial swelling (right-sided).   Eyes: Negative for visual disturbance.  Respiratory: Positive for shortness of breath. Negative for cough and wheezing.   Cardiovascular: Positive for chest pain and leg swelling.  Gastrointestinal: Positive for abdominal pain. Negative for vomiting and diarrhea.  Neurological: Positive for headaches. Negative for weakness and numbness.  All other systems reviewed and are negative.    Allergies  Shrimp; Aspirin; Ibuprofen; Morphine and related; Penicillins; Strawberry; Tramadol; and Vicodin  Home Medications   Current Outpatient Rx  Name  Route  Sig  Dispense  Refill  . citalopram (CELEXA) 20 MG tablet   Oral   Take 20 mg by mouth daily.         . insulin aspart (NOVOLOG FLEXPEN) 100 UNIT/ML SOPN FlexPen   Subcutaneous   Inject 20 Units into the skin 3 (three) times daily with meals. If CBG is above 200, take additional 10 units-per MD.         . insulin glargine (LANTUS) 100 UNIT/ML injection   Subcutaneous   Inject 80-100 Units into the skin 2 (two) times daily. Takes 80 units in AM, then takes 100 units in PM every  day.         . lisinopril-hydrochlorothiazide (PRINZIDE,ZESTORETIC) 20-12.5 MG per tablet   Oral   Take 1 tablet by mouth 2 (two) times daily.         . pantoprazole (PROTONIX) 20 MG tablet   Oral   Take 1 tablet (20 mg total) by mouth daily.   14 tablet   0   . ranitidine (ZANTAC) 150 MG tablet   Oral   Take 1 tablet (150 mg total) by mouth 2 (two) times daily.   60 tablet   0   . sitaGLIPtin (JANUVIA) 100 MG tablet   Oral   Take 100 mg by mouth daily.         . valACYclovir (VALTREX) 500 MG tablet   Oral   Take 500 mg by mouth daily.         . naphazoline-pheniramine  (NAPHCON-A) 0.025-0.3 % ophthalmic solution   Right Eye   Place 1 drop into the right eye every 6 (six) hours as needed.   15 mL   0   . ondansetron (ZOFRAN) 4 MG tablet   Oral   Take 1 tablet (4 mg total) by mouth every 6 (six) hours. As needed for n/v   12 tablet   0   . promethazine (PHENERGAN) 25 MG tablet   Oral   Take 1 tablet (25 mg total) by mouth every 6 (six) hours as needed for nausea.   12 tablet   0    BP 133/74  Pulse 94  Temp(Src) 98.8 F (37.1 C) (Oral)  Resp 16  SpO2 99%  LMP 10/08/2012 Physical Exam  Nursing note and vitals reviewed. Constitutional: She is oriented to person, place, and time. She appears well-developed and well-nourished.  HENT:  Head: Normocephalic and atraumatic.  Right Ear: External ear normal.  Left Ear: External ear normal.  Nose: Nose normal.  No facial swelling  Eyes: Pupils are equal, round, and reactive to light. Right eye exhibits no discharge. Left eye exhibits no discharge.  Neck: Normal range of motion.  Cardiovascular: Normal rate, regular rhythm and normal heart sounds.   Pulmonary/Chest: Effort normal and breath sounds normal. She has no wheezes. She has no rales.  Abdominal: Soft. There is no tenderness.  Musculoskeletal:  No appreciable pitting edema in her lower extremities.  Neurological: She is alert and oriented to person, place, and time.  Skin: Skin is warm and dry.    ED Course  Procedures (including critical care time) Labs Review Labs Reviewed  COMPREHENSIVE METABOLIC PANEL - Abnormal; Notable for the following:    Glucose, Bld 215 (*)    Total Bilirubin 0.2 (*)    All other components within normal limits  CBC  PRO B NATRIURETIC PEPTIDE  D-DIMER, QUANTITATIVE  POCT I-STAT TROPONIN I    Date: 10/15/2012  Rate: 86  Rhythm: normal sinus rhythm  QRS Axis: indeterminate  Intervals: normal  ST/T Wave abnormalities: normal  Conduction Disutrbances:none  Narrative Interpretation:   Old EKG  Reviewed: unchanged   Imaging Review Dg Chest 2 View  10/15/2012   *RADIOLOGY REPORT*  Clinical Data: Shortness of breath  CHEST - 2 VIEW  Comparison: None.  Findings: Cardiomediastinal silhouette appears normal.  No acute pulmonary disease is noted.  Bony thorax is intact.  IMPRESSION: No acute cardiopulmonary abnormality seen.   Original Report Authenticated By: Lupita Raider.,  M.D.    MDM   1. Leg swelling    The patient's complaints are chronic.  Her chest x-ray is benign. Her lung exam is normal without wheezing or distress. Her labs show no acute abnormalities. She is low risk for PE with a negative d-dimer I feel that no workup is indicated. Her BNP was evaluated due to patient endorsing orthopnea leg swelling but it is also negative. Due to her chronic symptoms I feel one negative troponin is sufficient to rule out ACS. On reevaluation she still appears well filler no emergent conditions at this time. We'll have her followup with her PCP for further evaluation.    Audree Camel, MD 10/15/12 2328

## 2012-11-06 ENCOUNTER — Emergency Department (HOSPITAL_COMMUNITY)
Admission: EM | Admit: 2012-11-06 | Discharge: 2012-11-06 | Disposition: A | Discharge status: Home / Self Care | Payer: Medicaid Other | Attending: Emergency Medicine | Admitting: Emergency Medicine

## 2012-11-06 ENCOUNTER — Encounter (HOSPITAL_COMMUNITY): Payer: Self-pay | Admitting: Emergency Medicine

## 2012-11-06 DIAGNOSIS — R209 Unspecified disturbances of skin sensation: Secondary | ICD-10-CM | POA: Insufficient documentation

## 2012-11-06 DIAGNOSIS — Z79899 Other long term (current) drug therapy: Secondary | ICD-10-CM | POA: Insufficient documentation

## 2012-11-06 DIAGNOSIS — J45909 Unspecified asthma, uncomplicated: Secondary | ICD-10-CM | POA: Insufficient documentation

## 2012-11-06 DIAGNOSIS — F3289 Other specified depressive episodes: Secondary | ICD-10-CM | POA: Insufficient documentation

## 2012-11-06 DIAGNOSIS — M79609 Pain in unspecified limb: Secondary | ICD-10-CM

## 2012-11-06 DIAGNOSIS — F329 Major depressive disorder, single episode, unspecified: Secondary | ICD-10-CM | POA: Insufficient documentation

## 2012-11-06 DIAGNOSIS — F17200 Nicotine dependence, unspecified, uncomplicated: Secondary | ICD-10-CM | POA: Insufficient documentation

## 2012-11-06 DIAGNOSIS — Z794 Long term (current) use of insulin: Secondary | ICD-10-CM | POA: Insufficient documentation

## 2012-11-06 DIAGNOSIS — I10 Essential (primary) hypertension: Secondary | ICD-10-CM | POA: Insufficient documentation

## 2012-11-06 DIAGNOSIS — Z880 Allergy status to penicillin: Secondary | ICD-10-CM | POA: Insufficient documentation

## 2012-11-06 DIAGNOSIS — E119 Type 2 diabetes mellitus without complications: Secondary | ICD-10-CM | POA: Insufficient documentation

## 2012-11-06 NOTE — ED Provider Notes (Signed)
CSN: [id number]     Arrival date & time 11/06/12  5284 History   First MD Initiated Contact with Patient 11/06/12 [phone]     Chief Complaint  Patient presents with  . Leg Pain    Left leg  . Arm Pain    Right arm   (Consider location/radiation/quality/duration/timing/severity/associated sxs/prior Treatment) HPI  Morgan Mcdonald is a 32 y.o. female past medical history significant for asthma, diabetes and hypertension complaining of left leg and right arm pins and needles paresthesia intermittently since Thursday. Patient's been taking Percocet at home with little relief.  She was seen by her primary care physicians at [facility] clinic and was told that she may have a blood clot. She has a lower extremity venous Doppler scheduled for the seventh. Patient denies any history of prior DVT or PE, recent immobilizations, exogenous estrogen, chest pain, shortness of breath.   Past Medical History  Diagnosis Date  . Hypertension   . Diabetes mellitus without complication   . Asthma   . Depression   . Leg swelling    History reviewed. No pertinent past surgical history. No family history on file. History  Substance Use Topics  . Smoking status: Current Every Day Smoker  . Smokeless tobacco: Not on file  . Alcohol Use: No   OB History   Grav Para Term Preterm Abortions TAB SAB Ect Mult Living                 Review of Systems 10 systems reviewed and found to be negative, except as noted in the HPI   Allergies  Shrimp; Aspirin; Other; Ibuprofen; Morphine and related; Penicillins; Strawberry; Tramadol; and Vicodin  Home Medications   Current Outpatient Rx  Name  Route  Sig  Dispense  Refill  . citalopram (CELEXA) 20 MG tablet   Oral   Take 20 mg by mouth daily.         . insulin aspart (NOVOLOG FLEXPEN) 100 UNIT/ML SOPN FlexPen   Subcutaneous   Inject 30 Units into the skin daily with breakfast. If CBG is above 200, take additional 10 units-per MD.         . insulin  glargine (LANTUS) 100 UNIT/ML injection   Subcutaneous   Inject 80-100 Units into the skin 2 (two) times daily. Takes 80 units in AM, then takes 100 units in PM every day.         . lisinopril-hydrochlorothiazide (PRINZIDE,ZESTORETIC) 20-12.5 MG per tablet   Oral   Take 1 tablet by mouth 2 (two) times daily.         . pantoprazole (PROTONIX) 20 MG tablet   Oral   Take 1 tablet (20 mg total) by mouth daily.   14 tablet   0   . PRESCRIPTION MEDICATION   Topical   Apply 1 application topically 2 (two) times daily. Prescription hemorrhoid cream         . QUEtiapine Fumarate (SEROQUEL XR) 150 MG 24 hr tablet   Oral   Take 150 mg by mouth at bedtime.         . sitaGLIPtin (JANUVIA) 100 MG tablet   Oral   Take 100 mg by mouth at bedtime.          . valACYclovir (VALTREX) 500 MG tablet   Oral   Take 500 mg by mouth daily.          BP 135/87  Pulse 95  Temp(Src) 98.8 F (37.1 C) (Oral)  Resp 20  Ht 5\' 6"  (1.676 m)  Wt 230 lb (104.327 kg)  BMI 37.14 kg/m2  SpO2 97%  LMP 10/08/2012 Physical Exam  Nursing note and vitals reviewed. Constitutional: She is oriented to person, place, and time. She appears well-developed and well-nourished. No distress.  HENT:  Head: Normocephalic.  Mouth/Throat: Oropharynx is clear and moist.  Eyes: Conjunctivae and EOM are normal. Pupils are equal, round, and reactive to light.  Neck: Normal range of motion.  Cardiovascular: Normal rate.   Pulmonary/Chest: Effort normal and breath sounds normal. No stridor. No respiratory distress. She has no wheezes. She has no rales. She exhibits no tenderness.  Abdominal: Soft. Bowel sounds are normal. She exhibits no distension and no mass. There is no rebound and no guarding.  Musculoskeletal: Normal range of motion. She exhibits no edema.  No calf asymmetry, superficial collaterals, palpable cords, edema, Homans sign negative bilaterally.    Neurological: She is alert and oriented to person,  place, and time.  Psychiatric: She has a normal mood and affect.    ED Course  Procedures (including critical care time) Labs Review Labs Reviewed - No data to display Imaging Review No results found.  MDM  No diagnosis found.  Filed Vitals:   11/06/12 0845 11/06/12 0900  BP: 135/87 126/72  Pulse: 95 88  Temp: 98.8 F (37.1 C)   TempSrc: Oral   Resp: 20   Height: 5\' 6"  (1.676 m)   Weight: 230 lb (104.327 kg)   SpO2: 97% 98%     Morgan Mcdonald is a 32 y.o. female with paresthesia to left lower and right upper extremity. Physical exam is not consistent with DVT. Patient has no risk factors and is low risk I will prescription. For DVT. I have explained to the patient I do not think it is a very high probability of this at all and that we would not be doing venous Dopplers in the ED. I have discussed this with the attending physician who agrees with care plan. I have reassured the patient and she is amenable to care plan and discharged home.   Pt is hemodynamically stable, appropriate for, and amenable to discharge at this time. Pt verbalized understanding and agrees with care plan. All questions answered. Outpatient follow-up and specific return precautions discussed.    Note: Portions of this report may have been transcribed using voice recognition software. Every effort was made to ensure accuracy; however, inadvertent computerized transcription errors may be present      Wynetta Emery, PA-C 11/06/12 [phone]

## 2012-11-06 NOTE — ED Notes (Signed)
Pt c/o pain and tingling to left leg and right arm since Thursday. Pt seen by her PMD on Thursday and was told she may have a blood clot. Pt is scheduled for ultrasound on the 7 th.

## 2012-11-06 NOTE — ED Provider Notes (Signed)
Medical screening examination/treatment/procedure(s) were performed by non-physician practitioner and as supervising physician I was immediately available for consultation/collaboration.  Megan E Docherty, MD 11/06/12 1723 

## 2012-11-19 ENCOUNTER — Encounter: Payer: Self-pay | Admitting: Gastroenterology

## 2012-11-29 ENCOUNTER — Emergency Department (HOSPITAL_COMMUNITY)
Admission: EM | Admit: 2012-11-29 | Discharge: 2012-11-29 | Disposition: A | Discharge status: Home / Self Care | Payer: Medicaid Other | Attending: Emergency Medicine | Admitting: Emergency Medicine

## 2012-11-29 ENCOUNTER — Encounter (HOSPITAL_COMMUNITY): Payer: Self-pay | Admitting: Emergency Medicine

## 2012-11-29 ENCOUNTER — Ambulatory Visit: Payer: Medicaid Other | Admitting: Gastroenterology

## 2012-11-29 DIAGNOSIS — L0291 Cutaneous abscess, unspecified: Secondary | ICD-10-CM

## 2012-11-29 DIAGNOSIS — Z880 Allergy status to penicillin: Secondary | ICD-10-CM | POA: Insufficient documentation

## 2012-11-29 DIAGNOSIS — Y929 Unspecified place or not applicable: Secondary | ICD-10-CM | POA: Insufficient documentation

## 2012-11-29 DIAGNOSIS — B3731 Acute candidiasis of vulva and vagina: Secondary | ICD-10-CM | POA: Insufficient documentation

## 2012-11-29 DIAGNOSIS — F329 Major depressive disorder, single episode, unspecified: Secondary | ICD-10-CM | POA: Insufficient documentation

## 2012-11-29 DIAGNOSIS — J45909 Unspecified asthma, uncomplicated: Secondary | ICD-10-CM | POA: Insufficient documentation

## 2012-11-29 DIAGNOSIS — Z794 Long term (current) use of insulin: Secondary | ICD-10-CM | POA: Insufficient documentation

## 2012-11-29 DIAGNOSIS — S30860A Insect bite (nonvenomous) of lower back and pelvis, initial encounter: Secondary | ICD-10-CM | POA: Insufficient documentation

## 2012-11-29 DIAGNOSIS — F3289 Other specified depressive episodes: Secondary | ICD-10-CM | POA: Insufficient documentation

## 2012-11-29 DIAGNOSIS — Z8739 Personal history of other diseases of the musculoskeletal system and connective tissue: Secondary | ICD-10-CM | POA: Insufficient documentation

## 2012-11-29 DIAGNOSIS — E119 Type 2 diabetes mellitus without complications: Secondary | ICD-10-CM | POA: Insufficient documentation

## 2012-11-29 DIAGNOSIS — K644 Residual hemorrhoidal skin tags: Secondary | ICD-10-CM | POA: Insufficient documentation

## 2012-11-29 DIAGNOSIS — B373 Candidiasis of vulva and vagina: Secondary | ICD-10-CM

## 2012-11-29 DIAGNOSIS — F17200 Nicotine dependence, unspecified, uncomplicated: Secondary | ICD-10-CM | POA: Insufficient documentation

## 2012-11-29 DIAGNOSIS — IMO0001 Reserved for inherently not codable concepts without codable children: Secondary | ICD-10-CM | POA: Insufficient documentation

## 2012-11-29 DIAGNOSIS — W57XXXA Bitten or stung by nonvenomous insect and other nonvenomous arthropods, initial encounter: Secondary | ICD-10-CM | POA: Insufficient documentation

## 2012-11-29 DIAGNOSIS — I10 Essential (primary) hypertension: Secondary | ICD-10-CM | POA: Insufficient documentation

## 2012-11-29 DIAGNOSIS — Z79899 Other long term (current) drug therapy: Secondary | ICD-10-CM | POA: Insufficient documentation

## 2012-11-29 DIAGNOSIS — Y939 Activity, unspecified: Secondary | ICD-10-CM | POA: Insufficient documentation

## 2012-11-29 MED ORDER — SULFAMETHOXAZOLE-TRIMETHOPRIM 800-160 MG PO TABS: 2.0000 | ORAL_TABLET | Freq: Two times a day (BID) | ORAL | Status: DC

## 2012-11-29 MED ORDER — POLYETHYLENE GLYCOL 3350 17 GM/SCOOP PO POWD: 17.0000 g | Freq: Every day | ORAL | Status: AC

## 2012-11-29 MED ORDER — DIPHENHYDRAMINE HCL 25 MG PO TABS: 25.0000 mg | ORAL_TABLET | Freq: Four times a day (QID) | ORAL | Status: DC

## 2012-11-29 MED ORDER — HYDROCORTISONE 2.5 % RE CREA: TOPICAL_CREAM | RECTAL | Status: DC

## 2012-11-29 MED ORDER — FLUCONAZOLE 150 MG PO TABS: 150.0000 mg | ORAL_TABLET | Freq: Once | ORAL | Status: DC

## 2012-11-29 MED ORDER — DOCUSATE SODIUM 100 MG PO CAPS
100.0000 mg | ORAL_CAPSULE | Freq: Two times a day (BID) | ORAL | Status: DC
Start: 2012-11-29 — End: 2012-12-30

## 2012-11-29 MED ORDER — DIPHENHYDRAMINE HCL 25 MG PO CAPS
25.0000 mg | ORAL_CAPSULE | Freq: Once | ORAL | Status: AC
  Administered 2012-11-29: 25 mg via ORAL
  Filled 2012-11-29: qty 1

## 2012-11-29 NOTE — ED Provider Notes (Signed)
CSN: [id number]     Arrival date & time 11/29/12  1257 History  This chart was scribed for non-physician practitioner Santiago Glad, PA-C working with Junius Argyle, MD by Valera Castle, ED scribe. This patient was seen in room WTR6/WTR6 and the patient's care was started at 1:46 PM.    Chief Complaint  Patient presents with  . Insect Bite  . Hemorrhoids   The history is provided by the patient. No language interpreter was used.   HPI Comments: Morgan Mcdonald is a 32 y.o. female who presents to the Emergency Department complaining of gradual, painful hemorrhoids that are "hanging out", onset 4 months ago. She reports pain to the area after sitting down for any extended period of time. She reports trying hemorrhoid cream after seeing Dr. Brendia Sacks, with no relief. She states that Dr. Brendia Sacks wanted her to set up an appointment to have the hemorrhoids removed, but she states she cannot wait that long. She reports constipation, diarrhea, pain with bowel movement, and occasional bright red bleeding from the area.   She also reports irritating, itchy bed bug bites to her bilateral arms and lower back. She reports that she has had the bites for a few weeks, but that they have worsened since last night. She reports scratching and digging at them. She denies having tried anything for the bites. She reports living at an all women's house, and being in proximity to multiple beds.   She reports itchiness and irration to her vaginal area, but denies any discharge to her knowledge.   She reports that there is an area on her lower back that is painful to the touch. Area has been there for the past week.  She denies noticing any drainage from the area.  She denies any treatment prior to arrival.  She denies fever or chills.    She denies fever, and any other associated symptoms. She denies any pertinent medical history.   PCP - No PCP Per Patient  Past Medical History  Diagnosis Date  . Hypertension    . Diabetes mellitus without complication   . Asthma   . Depression   . Leg swelling    History reviewed. No pertinent past surgical history. No family history on file. History  Substance Use Topics  . Smoking status: Current Every Day Smoker  . Smokeless tobacco: Not on file  . Alcohol Use: No   OB History   Grav Para Term Preterm Abortions TAB SAB Ect Mult Living                 Review of Systems  Constitutional: Negative for fever.  Genitourinary: Negative for vaginal discharge.       Positive for hemorrhoids, vaginal irritation.   Skin: Positive for wound (insect bites to bilateral arms. painful area to lower back.).  All other systems reviewed and are negative.    Allergies  Shrimp; Aspirin; Other; Ibuprofen; Morphine and related; Penicillins; Strawberry; Tramadol; and Vicodin  Home Medications   Current Outpatient Rx  Name  Route  Sig  Dispense  Refill  . citalopram (CELEXA) 20 MG tablet   Oral   Take 20 mg by mouth daily.         . furosemide (LASIX) 20 MG tablet   Oral   Take 20 mg by mouth at bedtime.         . gabapentin (NEURONTIN) 300 MG capsule   Oral   Take 300 mg by mouth daily.         Marland Kitchen  insulin aspart (NOVOLOG FLEXPEN) 100 UNIT/ML SOPN FlexPen   Subcutaneous   Inject 30 Units into the skin daily with breakfast. If CBG is above 200, take additional 10 units-per MD.         . insulin glargine (LANTUS) 100 UNIT/ML injection   Subcutaneous   Inject 80-100 Units into the skin 2 (two) times daily. Takes 80 units in AM, then takes 100 units in PM every day.         . lisinopril-hydrochlorothiazide (PRINZIDE,ZESTORETIC) 20-12.5 MG per tablet   Oral   Take 1 tablet by mouth 2 (two) times daily.         . pantoprazole (PROTONIX) 20 MG tablet   Oral   Take 1 tablet (20 mg total) by mouth daily.   14 tablet   0   . QUEtiapine Fumarate (SEROQUEL XR) 150 MG 24 hr tablet   Oral   Take 150 mg by mouth at bedtime.         .  sitaGLIPtin (JANUVIA) 100 MG tablet   Oral   Take 100 mg by mouth at bedtime.          . valACYclovir (VALTREX) 500 MG tablet   Oral   Take 500 mg by mouth daily.          Triage Vitals: BP 132/90  Pulse 90  Temp(Src) 98.7 F (37.1 C) (Oral)  Resp 18  SpO2 98%  Physical Exam  Nursing note and vitals reviewed. Constitutional: She is oriented to person, place, and time. She appears well-developed and well-nourished. No distress.  HENT:  Head: Normocephalic and atraumatic.  Eyes: EOM are normal.  Neck: Neck supple. No tracheal deviation present.  Cardiovascular: Normal rate, regular rhythm and normal heart sounds.   Pulmonary/Chest: Effort normal and breath sounds normal. No respiratory distress.  Genitourinary:  Small non thrombosed external hemorrhoids at 12:00 position.   Thick whitish colored discharge and irritation of the external vagina.  Patient declined pelvic exam.   Musculoskeletal: Normal range of motion.  Neurological: She is alert and oriented to person, place, and time.  Skin: Skin is warm and dry.     Erythematous papules on right arm. Pruritic. No drainage. No fluctuance.   1 cm diameter abscess without surrounding erythema or edema. No drainage.   Psychiatric: She has a normal mood and affect. Her behavior is normal.    ED Course  Procedures (including critical care time)  DIAGNOSTIC STUDIES: Oxygen Saturation is 98% on room air, normal by my interpretation.    COORDINATION OF CARE: 1:59 PM-Discussed treatment plan which includes an I&D and Benadryl with pt at bedside and pt agreed to plan. Discussed with pt the ED cannot perform a removal of hemorrhoids.   INCISION AND DRAINAGE PROCEDURE NOTE: Patient identification was confirmed and verbal consent was obtained. This procedure was performed by Santiago Glad, PA-C at 2:13 PM. Site: lower back Sterile procedures observed: Iodine swab Needle size: 25 gauge Anesthetic used (type and amt): 2%  Lidocaine with Epinephrine Blade Type: Scalpel Drainage: Purulent Complexity: Simple   Labs Review Labs Reviewed - No data to display Imaging Review No results found.  EKG Interpretation   None      Meds ordered this encounter  Medications  . gabapentin (NEURONTIN) 300 MG capsule    Sig: Take 300 mg by mouth daily.    MDM  No diagnosis found. Patient presenting with several complaints.  Patient does have small non thrombosed external hemorrhoid.  Patient given Annusol  and also given Colace and Miralax.  Patient also with a small abscess of her lower back.  Abscess incised and drained with good results.  Patient also complaining of itching and irritation of the external vagina.  Most consistent with Candida infection.  Patient given Diflucan.  Patient also with rash consistent with bed bugs.  I personally performed the services described in this documentation, which was scribed in my presence. The recorded information has been reviewed and is accurate.    Santiago Glad, PA-C 11/30/12 2232

## 2012-11-29 NOTE — ED Notes (Signed)
Pt c/o insect bites to arms and lower back and hemorrhoids that has been hanging out for 4 months.

## 2012-11-29 NOTE — ED Notes (Signed)
Bed: WTR6 Expected date:  Expected time:  Means of arrival:  Comments: scabies

## 2012-12-01 ENCOUNTER — Emergency Department (INDEPENDENT_AMBULATORY_CARE_PROVIDER_SITE_OTHER)
Admission: EM | Admit: 2012-12-01 | Discharge: 2012-12-01 | Disposition: A | Discharge status: Home / Self Care | Payer: Medicaid Other | Source: Home / Self Care

## 2012-12-01 DIAGNOSIS — L0291 Cutaneous abscess, unspecified: Secondary | ICD-10-CM

## 2012-12-01 MED ORDER — OXYCODONE-ACETAMINOPHEN 5-325 MG PO TABS: ORAL_TABLET | ORAL | Status: DC

## 2012-12-01 MED ORDER — SULFAMETHOXAZOLE-TMP DS 800-160 MG PO TABS: 2.0000 | ORAL_TABLET | Freq: Two times a day (BID) | ORAL | Status: DC

## 2012-12-01 NOTE — ED Provider Notes (Signed)
Medical screening examination/treatment/procedure(s) were performed by non-physician practitioner and as supervising physician I was immediately available for consultation/collaboration.    Junius Argyle, MD 12/01/12 [phone]

## 2012-12-01 NOTE — ED Provider Notes (Signed)
Chief Complaint:  No chief complaint on file.   History of Present Illness:    Morgan Mcdonald is a 32 year old female who presents for followup on a boil on her mid back area. She was seen yesterday at [facility] and the boil was incised and drained. It was not cultured or packed and she was not placed any antibiotics. It still very painful. The pain radiates into her left flank and down her lateral thigh. She has felt hot but not had any definite fever. This abscess is been going on for about 4 days. She also was also seen for a number of other issues that are still ongoing including: protruding hemorrhoids which are painful and bleeding, a yeast infection, and insect bites on her arms and legs. She states she's tried everything for the hemorrhoids including suppositories and creams and nothing has worked. She was referred to a gastroenterologist, but won't be able to get in to see them until December. She was given some cortisone cream to the insect bites. She's also taking a single Diflucan pill for vaginal yeast infection. Her biggest issue today is the abscess. She does not have any prior history of skin abscesses, infections, or MRSA. She does have diabetes.  Review of Systems:  Other than noted above, the patient denies any of the following symptoms: Systemic:  No fever, chills or sweats. Skin:  No rash or itching.  PMFSH:  Past medical history, family history, social history, meds, and allergies were reviewed.  No prior history of abscesses or MRSA. She has bipolar disorder, chronic back pain, diabetes, and hypertension. She is allergic to multiple medications including ibuprofen, Vicodin, aspirin, tramadol, morphine, and penicillin. She is able to take Percocet. She takes lisinopril, Zoloft, gabapentin, NovoLog, and Lantus.  Physical Exam:   Vital signs:  BP 155/100  Pulse 105  Temp(Src) 99.8 F (37.7 C) (Oral)  Resp 21  SpO2 98% Skin:  On her back there is a large, painful,  indurated abscess. This has a tiny incision in the center with. This was not draining and was not packed. It does not feel fluctuant.  Skin exam was otherwise normal.  No rash. Ext:  Distal pulses were full, patient has full ROM of all joints.  Procedure:  Verbal informed consent was obtained.  The patient was informed of the risks and benefits of the procedure and understands and accepts.  Identity of the patient was verified verbally and by wristband.   The abscess area described above was prepped with Betadine and alcohol and anesthetized with 5 mL of 2% Xylocaine with epinephrine.  Using a #11 scalpel blade, a singe 1 inch straight incision was made into the area of fluctulence, yielding a large amount of prurulent drainage.  Routine cultures were obtained.  Blunt dissection was used to break up loculations and the resulting wound cavity was packed with 1/4 inch Iodoform gauze.  A sterile pressure dressing was applied.  Assessment:  The encounter diagnosis was Abscess.  Plan:   1.  Meds:  The following meds were prescribed:   Discharge Medication List as of 12/01/2012  6:12 PM    START taking these medications   Details  oxyCODONE-acetaminophen (PERCOCET) 5-325 MG per tablet 1 to 2 tablets every 6 hours as needed for pain., Print    !! sulfamethoxazole-trimethoprim (BACTRIM DS) 800-160 MG per tablet Take 2 tablets by mouth 2 (two) times daily., Starting 12/01/2012, Until Discontinued, Normal     !! - Potential duplicate medications found.  Please discuss with provider.      2.  Patient Education/Counseling:  The patient was given appropriate handouts, self care instructions, and instructed in symptomatic relief.   3.  Follow up:  The patient was instructed to leave the dressing in place and return again in 48 hours for packing removal, if becoming worse in any way, and given some red flag symptoms such as fever which would prompt immediate return.  Follow up here in 48  hours.     Reuben Likes, MD 12/01/12 2242

## 2012-12-03 ENCOUNTER — Encounter (HOSPITAL_COMMUNITY): Payer: Self-pay | Admitting: Emergency Medicine

## 2012-12-03 ENCOUNTER — Emergency Department (HOSPITAL_COMMUNITY)
Admission: EM | Admit: 2012-12-03 | Discharge: 2012-12-03 | Disposition: A | Discharge status: Home / Self Care | Payer: Medicaid Other | Attending: Emergency Medicine | Admitting: Emergency Medicine

## 2012-12-03 DIAGNOSIS — Z79899 Other long term (current) drug therapy: Secondary | ICD-10-CM | POA: Insufficient documentation

## 2012-12-03 DIAGNOSIS — Z880 Allergy status to penicillin: Secondary | ICD-10-CM | POA: Insufficient documentation

## 2012-12-03 DIAGNOSIS — Z794 Long term (current) use of insulin: Secondary | ICD-10-CM | POA: Insufficient documentation

## 2012-12-03 DIAGNOSIS — Z4800 Encounter for change or removal of nonsurgical wound dressing: Secondary | ICD-10-CM

## 2012-12-03 DIAGNOSIS — Y838 Other surgical procedures as the cause of abnormal reaction of the patient, or of later complication, without mention of misadventure at the time of the procedure: Secondary | ICD-10-CM | POA: Insufficient documentation

## 2012-12-03 DIAGNOSIS — R6883 Chills (without fever): Secondary | ICD-10-CM | POA: Insufficient documentation

## 2012-12-03 DIAGNOSIS — F329 Major depressive disorder, single episode, unspecified: Secondary | ICD-10-CM | POA: Insufficient documentation

## 2012-12-03 DIAGNOSIS — J45909 Unspecified asthma, uncomplicated: Secondary | ICD-10-CM | POA: Insufficient documentation

## 2012-12-03 DIAGNOSIS — E119 Type 2 diabetes mellitus without complications: Secondary | ICD-10-CM | POA: Insufficient documentation

## 2012-12-03 DIAGNOSIS — Z792 Long term (current) use of antibiotics: Secondary | ICD-10-CM | POA: Insufficient documentation

## 2012-12-03 DIAGNOSIS — IMO0002 Reserved for concepts with insufficient information to code with codable children: Secondary | ICD-10-CM | POA: Insufficient documentation

## 2012-12-03 DIAGNOSIS — F3289 Other specified depressive episodes: Secondary | ICD-10-CM | POA: Insufficient documentation

## 2012-12-03 DIAGNOSIS — L0291 Cutaneous abscess, unspecified: Secondary | ICD-10-CM

## 2012-12-03 DIAGNOSIS — I10 Essential (primary) hypertension: Secondary | ICD-10-CM | POA: Insufficient documentation

## 2012-12-03 DIAGNOSIS — F17200 Nicotine dependence, unspecified, uncomplicated: Secondary | ICD-10-CM | POA: Insufficient documentation

## 2012-12-03 DIAGNOSIS — T8140XA Infection following a procedure, unspecified, initial encounter: Secondary | ICD-10-CM | POA: Insufficient documentation

## 2012-12-03 MED ORDER — OXYCODONE-ACETAMINOPHEN 5-325 MG PO TABS
2.0000 | ORAL_TABLET | Freq: Once | ORAL | Status: AC
  Administered 2012-12-03: 2 via ORAL
  Filled 2012-12-03: qty 2

## 2012-12-03 NOTE — ED Provider Notes (Signed)
Medical screening examination/treatment/procedure(s) were performed by non-physician practitioner and as supervising physician I was immediately available for consultation/collaboration.   Sophronia Varney T Dang Mathison, MD 12/03/12 1556 

## 2012-12-03 NOTE — ED Provider Notes (Signed)
CSN: [id number]     Arrival date & time 12/03/12  1145 History   First MD Initiated Contact with Patient 12/03/12 1204     Chief Complaint  Patient presents with  . Wound Infection   (Consider location/radiation/quality/duration/timing/severity/associated sxs/prior Treatment) HPI Comments: Patient is a 32 year old female who presents to the emergency department for packing removal from an abscess on the left side of her lower back that was drained 2 days ago at urgent care Center. States the area is draining a lot of blood and is painful. Also stating she has another abscess under her right axilla that she noticed yesterday, increased in size today. Painful to touch. She has not been applying any warm compresses. Denies fever, states she has chills. She has been taking Bactrim as prescribed.  The history is provided by the patient.    Past Medical History  Diagnosis Date  . Hypertension   . Diabetes mellitus without complication   . Asthma   . Depression   . Leg swelling    History reviewed. No pertinent past surgical history. History reviewed. No pertinent family history. History  Substance Use Topics  . Smoking status: Current Every Day Smoker  . Smokeless tobacco: Not on file  . Alcohol Use: No   OB History   Grav Para Term Preterm Abortions TAB SAB Ect Mult Living                 Review of Systems  Constitutional: Positive for chills.  Skin:       Positive for abscess.  All other systems reviewed and are negative.    Allergies  Shrimp; Aspirin; Other; Ibuprofen; Morphine and related; Penicillins; Strawberry; Tramadol; and Vicodin  Home Medications   Current Outpatient Rx  Name  Route  Sig  Dispense  Refill  . citalopram (CELEXA) 20 MG tablet   Oral   Take 20 mg by mouth daily.         . diphenhydrAMINE (BENADRYL) 25 MG tablet   Oral   Take 1 tablet (25 mg total) by mouth every 6 (six) hours.   20 tablet   0   . docusate sodium (COLACE) 100 MG  capsule   Oral   Take 1 capsule (100 mg total) by mouth every 12 (twelve) hours.   60 capsule   0   . fluconazole (DIFLUCAN) 150 MG tablet   Oral   Take 1 tablet (150 mg total) by mouth once.   1 tablet   0   . furosemide (LASIX) 20 MG tablet   Oral   Take 20 mg by mouth at bedtime.         . gabapentin (NEURONTIN) 300 MG capsule   Oral   Take 300 mg by mouth daily.         . hydrocortisone (ANUSOL-HC) 2.5 % rectal cream      Apply rectally 2 times daily   30 g   0   . insulin aspart (NOVOLOG FLEXPEN) 100 UNIT/ML SOPN FlexPen   Subcutaneous   Inject 30 Units into the skin daily with breakfast. If CBG is above 200, take additional 10 units-per MD.         . insulin glargine (LANTUS) 100 UNIT/ML injection   Subcutaneous   Inject 80-100 Units into the skin 2 (two) times daily. Takes 80 units in AM, then takes 100 units in PM every day.         . lisinopril-hydrochlorothiazide (PRINZIDE,ZESTORETIC) 20-12.5 MG per tablet  Oral   Take 1 tablet by mouth 2 (two) times daily.         Marland Kitchen oxyCODONE-acetaminophen (PERCOCET) 5-325 MG per tablet      1 to 2 tablets every 6 hours as needed for pain.   20 tablet   0   . pantoprazole (PROTONIX) 20 MG tablet   Oral   Take 1 tablet (20 mg total) by mouth daily.   14 tablet   0   . polyethylene glycol powder (GLYCOLAX/MIRALAX) powder   Oral   Take 17 g by mouth daily.   255 g   0   . QUEtiapine Fumarate (SEROQUEL XR) 150 MG 24 hr tablet   Oral   Take 150 mg by mouth at bedtime.         . sitaGLIPtin (JANUVIA) 100 MG tablet   Oral   Take 100 mg by mouth at bedtime.          . sulfamethoxazole-trimethoprim (BACTRIM DS) 800-160 MG per tablet   Oral   Take 2 tablets by mouth 2 (two) times daily.   40 tablet   0   . sulfamethoxazole-trimethoprim (SEPTRA DS) 800-160 MG per tablet   Oral   Take 2 tablets by mouth 2 (two) times daily.   40 tablet   0   . valACYclovir (VALTREX) 500 MG tablet   Oral    Take 500 mg by mouth daily.          BP 121/81  Pulse 112  Temp(Src) 98.4 F (36.9 C) (Oral)  Resp 22  Ht 5\' 6"  (1.676 m)  Wt 236 lb (107.049 kg)  BMI 38.11 kg/m2  SpO2 96% Physical Exam  Nursing note and vitals reviewed. Constitutional: She is oriented to person, place, and time. She appears well-developed and well-nourished. No distress.  HENT:  Head: Normocephalic and atraumatic.  Mouth/Throat: Oropharynx is clear and moist.  Eyes: Conjunctivae are normal.  Neck: Normal range of motion. Neck supple.  Cardiovascular: Normal rate, regular rhythm and normal heart sounds.   Pulmonary/Chest: Effort normal and breath sounds normal.  Musculoskeletal: Normal range of motion. She exhibits no edema.  Neurological: She is alert and oriented to person, place, and time.  Skin: Skin is warm and dry. She is not diaphoretic.  Small abscess on left-sided lower back with packing in place. No induration or fluctuance. No surrounding cellulitis. Small amount of blood expressed with packing removal, no purulent drainage. 1 cm diameter area of fluctuance with central pustule under right axilla.. No drainage or surrounding cellulitis.  Psychiatric: She has a normal mood and affect. Her behavior is normal.    ED Course  Procedures (including critical care time) INCISION AND DRAINAGE Performed by: Johnnette Gourd Consent: Verbal consent obtained. Risks and benefits: risks, benefits and alternatives were discussed Type: abscess  Body area: R axilla  Anesthesia: local infiltration  Incision was made with a scalpel.  Local anesthetic: lidocaine 2% with epinephrine  Anesthetic total: 3 ml  Complexity: complex Blunt dissection to break up loculations  Drainage: purulent  Drainage amount: small  Packing material: none  Patient tolerance: Patient tolerated the procedure well with no immediate complications.    Labs Review Labs Reviewed - No data to display Imaging Review No results  found.  EKG Interpretation   None       MDM   1. Abscess   2. Abscess packing removal    Packing removed from abscess on her back without any difficulty, no signs of infection.  Abscess is healing well. Abscess under right axilla drained with a small amount of purulent material. She is afebrile and in no apparent distress. Advised warm compresses. Followup with PCP in 2 days for recheck. Return precautions given. Patient states understanding of treatment care plan and is agreeable.     Trevor Mace, PA-C 12/03/12 1231

## 2012-12-03 NOTE — ED Notes (Addendum)
Pt reports she had a wound packed at [facility] on Wednesday and it continues to ooze blood. She can not get the bleeding to stop. Also has another abscess under her R armpit. Taking percocet with no pain relief

## 2012-12-14 NOTE — ED Notes (Signed)
11/5 Dr. Lorenz Coaster said he did not cancel the  Abscess culture. Message sent to [employer].  The lab called back on 11/6 and said they could not find the sample. Dr. Lorenz Coaster said no further action. Morgan Mcdonald 12/14/2012

## 2012-12-24 ENCOUNTER — Encounter (HOSPITAL_COMMUNITY): Payer: Self-pay | Admitting: Emergency Medicine

## 2012-12-24 ENCOUNTER — Emergency Department (HOSPITAL_COMMUNITY): Payer: Medicaid Other

## 2012-12-24 ENCOUNTER — Emergency Department (HOSPITAL_COMMUNITY)
Admission: EM | Admit: 2012-12-24 | Discharge: 2012-12-24 | Disposition: A | Discharge status: Home / Self Care | Payer: Medicaid Other | Attending: Emergency Medicine | Admitting: Emergency Medicine

## 2012-12-24 DIAGNOSIS — E119 Type 2 diabetes mellitus without complications: Secondary | ICD-10-CM | POA: Insufficient documentation

## 2012-12-24 DIAGNOSIS — I10 Essential (primary) hypertension: Secondary | ICD-10-CM | POA: Insufficient documentation

## 2012-12-24 DIAGNOSIS — J9801 Acute bronchospasm: Secondary | ICD-10-CM

## 2012-12-24 DIAGNOSIS — F3289 Other specified depressive episodes: Secondary | ICD-10-CM | POA: Insufficient documentation

## 2012-12-24 DIAGNOSIS — Z8739 Personal history of other diseases of the musculoskeletal system and connective tissue: Secondary | ICD-10-CM | POA: Insufficient documentation

## 2012-12-24 DIAGNOSIS — Z792 Long term (current) use of antibiotics: Secondary | ICD-10-CM | POA: Insufficient documentation

## 2012-12-24 DIAGNOSIS — Z880 Allergy status to penicillin: Secondary | ICD-10-CM | POA: Insufficient documentation

## 2012-12-24 DIAGNOSIS — F329 Major depressive disorder, single episode, unspecified: Secondary | ICD-10-CM | POA: Insufficient documentation

## 2012-12-24 DIAGNOSIS — Z794 Long term (current) use of insulin: Secondary | ICD-10-CM | POA: Insufficient documentation

## 2012-12-24 DIAGNOSIS — Z79899 Other long term (current) drug therapy: Secondary | ICD-10-CM | POA: Insufficient documentation

## 2012-12-24 DIAGNOSIS — Z91040 Latex allergy status: Secondary | ICD-10-CM | POA: Insufficient documentation

## 2012-12-24 DIAGNOSIS — F17200 Nicotine dependence, unspecified, uncomplicated: Secondary | ICD-10-CM | POA: Insufficient documentation

## 2012-12-24 DIAGNOSIS — J45901 Unspecified asthma with (acute) exacerbation: Secondary | ICD-10-CM | POA: Insufficient documentation

## 2012-12-24 LAB — BASIC METABOLIC PANEL
Calcium: 9.6 mg/dL (ref 8.4–10.5)
GFR calc non Af Amer: >90 mL/min (ref >90)
Potassium: 3.8 mEq/L (ref 3.5–5.1)
Sodium: 131 mEq/L — ABNORMAL LOW (ref 135–145)

## 2012-12-24 LAB — POCT I-STAT TROPONIN I: Troponin i, poc: 0 ng/mL (ref 0.00–0.08)

## 2012-12-24 LAB — CBC
MCH: 28.4 pg (ref 26.0–34.0)
MCHC: 34.4 g/dL (ref 30.0–36.0)
Platelets: 336 10*3/uL (ref 150–400)
RBC: 4.55 MIL/uL (ref 3.87–5.11)

## 2012-12-24 MED ORDER — ALBUTEROL SULFATE HFA 108 (90 BASE) MCG/ACT IN AERS
2.0000 | INHALATION_SPRAY | RESPIRATORY_TRACT | Status: DC | PRN
  Administered 2012-12-24: 2 via RESPIRATORY_TRACT
  Filled 2012-12-24: qty 6.7

## 2012-12-24 NOTE — ED Notes (Signed)
To ED from home via [facility], c/o cough, URI s/s, asthma, LS clear per EMS, sats 97% on RA, other vitals stable, A/O X4, ambulatory and in NAD

## 2012-12-24 NOTE — ED Provider Notes (Signed)
CSN: [id number]     Arrival date & time 12/24/12  4098 History   First MD Initiated Contact with Patient 12/24/12 2104     Chief Complaint  Patient presents with  . Asthma   (Consider location/radiation/quality/duration/timing/severity/associated sxs/prior Treatment) HPI Comments: She states she has a history of, asthma.  She used to have a nebulizer machine, but 8 months ago, when she moved she [place] it in [place] part [place] the [place] and hasn't had a shipped to her For the past couple, days.  She's had intermittent episodes of wheezing.  She does not have an inhaler.  She denies any URI symptoms,fever.  Patient is a 32 y.o. female presenting with asthma. The history is provided by the patient.  Asthma This is a recurrent problem. The current episode started in the past 7 days. The problem occurs intermittently. Associated symptoms include chest pain and coughing. Pertinent negatives include no chills, fever, myalgias or nausea. The symptoms are aggravated by exertion. She has tried nothing for the symptoms. The treatment provided no relief.    Past Medical History  Diagnosis Date  . Hypertension   . Diabetes mellitus without complication   . Asthma   . Depression   . Leg swelling    History reviewed. No pertinent past surgical history. History reviewed. No pertinent family history. History  Substance Use Topics  . Smoking status: Current Every Day Smoker  . Smokeless tobacco: Not on file  . Alcohol Use: No   OB History   Grav Para Term Preterm Abortions TAB SAB Ect Mult Living                 Review of Systems  Constitutional: Negative for fever and chills.  HENT: Negative for postnasal drip.   Respiratory: Positive for cough, shortness of breath and wheezing.   Cardiovascular: Positive for chest pain.  Gastrointestinal: Negative for nausea.  Musculoskeletal: Negative for myalgias.  All other systems reviewed and are negative.    Allergies  Shrimp; Other; Aspirin;  Ibuprofen; Latex; Morphine and related; Penicillins; Strawberry; Tramadol; and Vicodin  Home Medications   Current Outpatient Rx  Name  Route  Sig  Dispense  Refill  . citalopram (CELEXA) 20 MG tablet   Oral   Take 20 mg by mouth daily.         . diphenhydrAMINE (BENADRYL) 25 MG tablet   Oral   Take 1 tablet (25 mg total) by mouth every 6 (six) hours.   20 tablet   0   . docusate sodium (COLACE) 100 MG capsule   Oral   Take 1 capsule (100 mg total) by mouth every 12 (twelve) hours.   60 capsule   0   . fluconazole (DIFLUCAN) 150 MG tablet   Oral   Take 1 tablet (150 mg total) by mouth once.   1 tablet   0   . furosemide (LASIX) 20 MG tablet   Oral   Take 20 mg by mouth at bedtime.         . gabapentin (NEURONTIN) 300 MG capsule   Oral   Take 300 mg by mouth at bedtime.          . insulin aspart (NOVOLOG FLEXPEN) 100 UNIT/ML SOPN FlexPen   Subcutaneous   Inject 30 Units into the skin 2 (two) times daily. If CBG is above 200, take additional 10 units-per MD.         . insulin glargine (LANTUS) 100 UNIT/ML injection   Subcutaneous  Inject 80-100 Units into the skin 2 (two) times daily. Takes 80 units in AM, then takes 100 units in PM every day.         . lisinopril-hydrochlorothiazide (PRINZIDE,ZESTORETIC) 20-12.5 MG per tablet   Oral   Take 1 tablet by mouth 2 (two) times daily.         Marland Kitchen oxyCODONE-acetaminophen (PERCOCET/ROXICET) 5-325 MG per tablet   Oral   Take 1-2 tablets by mouth every 4 (four) hours as needed for severe pain.         . pantoprazole (PROTONIX) 20 MG tablet   Oral   Take 1 tablet (20 mg total) by mouth daily.   14 tablet   0   . polyethylene glycol powder (GLYCOLAX/MIRALAX) powder   Oral   Take 17 g by mouth daily.   255 g   0   . QUEtiapine Fumarate (SEROQUEL XR) 150 MG 24 hr tablet   Oral   Take 150 mg by mouth at bedtime.         . sitaGLIPtin (JANUVIA) 100 MG tablet   Oral   Take 100 mg by mouth at bedtime.           . sulfamethoxazole-trimethoprim (BACTRIM DS) 800-160 MG per tablet   Oral   Take 2 tablets by mouth 2 (two) times daily.   40 tablet   0    BP 133/84  Pulse 95  Temp(Src) 98.4 F (36.9 C) (Oral)  Resp 18  Ht 5\' 6"  (1.676 m)  Wt 228 lb (103.42 kg)  BMI 36.82 kg/m2  SpO2 100%  LMP 12/06/2012 Physical Exam  Nursing note and vitals reviewed. Constitutional: She appears well-developed and well-nourished.  HENT:  Head: Normocephalic.  Eyes: Pupils are equal, round, and reactive to light.  Neck: Normal range of motion.  Cardiovascular: Normal rate and regular rhythm.   Pulmonary/Chest: Effort normal. She has decreased breath sounds.  No wheezing, heard.  The patient states she feels, "tight"  Musculoskeletal: Normal range of motion.  Neurological: She is alert.  Skin: Skin is warm.    ED Course  Procedures (including critical care time) Labs Review Labs Reviewed  BASIC METABOLIC PANEL - Abnormal; Notable for the following:    Sodium 131 (*)    Chloride 93 (*)    Glucose, Bld 431 (*)    All other components within normal limits  CBC  POCT I-STAT TROPONIN I   Imaging Review Dg Chest 2 View  12/24/2012   CLINICAL DATA:  Chest pain and shortness of breath  EXAM: CHEST  2 VIEW  COMPARISON:  10/15/2012  FINDINGS: The heart size and mediastinal contours are within normal limits. Both lungs are clear. The visualized skeletal structures are unremarkable.  IMPRESSION: No active cardiopulmonary disease.   Electronically Signed   By: Esperanza Heir M.D.   On: 12/24/2012 20:09    EKG Interpretation    Date/Time:  Friday December 24 2012 19:19:56 EST Ventricular Rate:  92 PR Interval:  150 QRS Duration: 88 QT Interval:  354 QTC Calculation: 437 R Axis:   10 Text Interpretation:  Normal sinus rhythm Moderate voltage criteria for LVH, may be normal variant Borderline ECG No significant change since last tracing Confirmed by SHELDON  MD, CHARLES ([id number]) on 12/24/2012  7:26:10 PM            MDM  No diagnosis found.  Patient given 2 puffs of an albuterol inhaler.  She is now moving much more air,  states she  feels much, better Will discharge home with inhaler.  Patient has a primary care physician, that she will call on Monday for an appointment    Arman Filter, NP 12/24/12 2151

## 2012-12-24 NOTE — ED Notes (Signed)
Patient asked for and received a Happy Meal and a Ginger Ale.

## 2012-12-24 NOTE — ED Notes (Signed)
Patient with increased shortness of breath with chest tightness.  Patient states she has been wheezing, history of asthma.

## 2012-12-26 NOTE — ED Provider Notes (Signed)
Medical screening examination/treatment/procedure(s) were performed by non-physician practitioner and as supervising physician I was immediately available for consultation/collaboration.  EKG Interpretation    Date/Time:  Friday December 24 2012 19:19:56 EST Ventricular Rate:  92 PR Interval:  150 QRS Duration: 88 QT Interval:  354 QTC Calculation: 437 R Axis:   10 Text Interpretation:  Normal sinus rhythm Moderate voltage criteria for LVH, may be normal variant Borderline ECG No significant change since last tracing Confirmed by Bernette Mayers  MD, CHARLES [phone]) on 12/24/2012 7:26:10 PM              Joya Gaskins, MD 12/26/12 1719

## 2012-12-30 ENCOUNTER — Emergency Department (HOSPITAL_COMMUNITY): Payer: Medicaid Other

## 2012-12-30 ENCOUNTER — Encounter (HOSPITAL_COMMUNITY): Payer: Self-pay | Admitting: Emergency Medicine

## 2012-12-30 ENCOUNTER — Emergency Department (HOSPITAL_COMMUNITY)
Admission: EM | Admit: 2012-12-30 | Discharge: 2012-12-30 | Disposition: A | Discharge status: Home / Self Care | Payer: Medicaid Other | Attending: Emergency Medicine | Admitting: Emergency Medicine

## 2012-12-30 DIAGNOSIS — F17200 Nicotine dependence, unspecified, uncomplicated: Secondary | ICD-10-CM | POA: Insufficient documentation

## 2012-12-30 DIAGNOSIS — R1084 Generalized abdominal pain: Secondary | ICD-10-CM | POA: Insufficient documentation

## 2012-12-30 DIAGNOSIS — J45909 Unspecified asthma, uncomplicated: Secondary | ICD-10-CM | POA: Insufficient documentation

## 2012-12-30 DIAGNOSIS — E119 Type 2 diabetes mellitus without complications: Secondary | ICD-10-CM | POA: Insufficient documentation

## 2012-12-30 DIAGNOSIS — R1012 Left upper quadrant pain: Secondary | ICD-10-CM | POA: Insufficient documentation

## 2012-12-30 DIAGNOSIS — R109 Unspecified abdominal pain: Secondary | ICD-10-CM

## 2012-12-30 DIAGNOSIS — F3289 Other specified depressive episodes: Secondary | ICD-10-CM | POA: Insufficient documentation

## 2012-12-30 DIAGNOSIS — Z3202 Encounter for pregnancy test, result negative: Secondary | ICD-10-CM | POA: Insufficient documentation

## 2012-12-30 DIAGNOSIS — R110 Nausea: Secondary | ICD-10-CM | POA: Insufficient documentation

## 2012-12-30 DIAGNOSIS — Z794 Long term (current) use of insulin: Secondary | ICD-10-CM | POA: Insufficient documentation

## 2012-12-30 DIAGNOSIS — F329 Major depressive disorder, single episode, unspecified: Secondary | ICD-10-CM | POA: Insufficient documentation

## 2012-12-30 DIAGNOSIS — Z79899 Other long term (current) drug therapy: Secondary | ICD-10-CM | POA: Insufficient documentation

## 2012-12-30 DIAGNOSIS — Z91040 Latex allergy status: Secondary | ICD-10-CM | POA: Insufficient documentation

## 2012-12-30 DIAGNOSIS — Z880 Allergy status to penicillin: Secondary | ICD-10-CM | POA: Insufficient documentation

## 2012-12-30 DIAGNOSIS — I10 Essential (primary) hypertension: Secondary | ICD-10-CM | POA: Insufficient documentation

## 2012-12-30 LAB — CBC WITH DIFFERENTIAL/PLATELET
Basophils Absolute: 0 10*3/uL (ref 0.0–0.1)
Basophils Relative: 0 % (ref 0–1)
MCHC: 34.4 g/dL (ref 30.0–36.0)
Neutro Abs: 4.9 10*3/uL (ref 1.7–7.7)
Neutrophils Relative %: 47 % (ref 43–77)
Platelets: 299 10*3/uL (ref 150–400)
RBC: 4.51 MIL/uL (ref 3.87–5.11)
RDW: 12.7 % (ref 11.5–15.5)

## 2012-12-30 LAB — COMPREHENSIVE METABOLIC PANEL
AST: 15 U/L (ref 0–37)
Albumin: 3.7 g/dL (ref 3.5–5.2)
Chloride: 96 mEq/L (ref 96–112)
Creatinine, Ser: 0.56 mg/dL (ref 0.50–1.10)
GFR calc Af Amer: >90 mL/min (ref >90)
GFR calc non Af Amer: >90 mL/min (ref >90)
Glucose, Bld: 382 mg/dL — ABNORMAL HIGH (ref 70–99)
Potassium: 4 mEq/L (ref 3.5–5.1)
Sodium: 133 mEq/L — ABNORMAL LOW (ref 135–145)
Total Bilirubin: 0.1 mg/dL — ABNORMAL LOW (ref 0.3–1.2)

## 2012-12-30 LAB — LIPASE, BLOOD: Lipase: 33 U/L (ref 11–59)

## 2012-12-30 LAB — GLUCOSE, CAPILLARY: Glucose-Capillary: 250 mg/dL — ABNORMAL HIGH (ref 70–99)

## 2012-12-30 LAB — POCT PREGNANCY, URINE: Preg Test, Ur: NEGATIVE

## 2012-12-30 MED ORDER — DIPHENHYDRAMINE HCL 50 MG/ML IJ SOLN
25.0000 mg | Freq: Once | INTRAMUSCULAR | Status: AC
  Administered 2012-12-30: 25 mg via INTRAVENOUS
  Filled 2012-12-30: qty 1

## 2012-12-30 MED ORDER — SODIUM CHLORIDE 0.9 % IV BOLUS (SEPSIS)
1000.0000 mL | Freq: Once | INTRAVENOUS | Status: AC
  Administered 2012-12-30: 1000 mL via INTRAVENOUS

## 2012-12-30 MED ORDER — DICYCLOMINE HCL 10 MG PO CAPS
10.0000 mg | ORAL_CAPSULE | Freq: Once | ORAL | Status: AC
  Administered 2012-12-30: 10 mg via ORAL
  Filled 2012-12-30: qty 1

## 2012-12-30 MED ORDER — ONDANSETRON HCL 4 MG PO TABS: 4.0000 mg | ORAL_TABLET | Freq: Four times a day (QID) | ORAL | Status: DC

## 2012-12-30 MED ORDER — KETOROLAC TROMETHAMINE 30 MG/ML IJ SOLN
30.0000 mg | Freq: Once | INTRAMUSCULAR | Status: AC
  Administered 2012-12-30: 30 mg via INTRAVENOUS
  Filled 2012-12-30: qty 1

## 2012-12-30 MED ORDER — METOCLOPRAMIDE HCL 5 MG/ML IJ SOLN
10.0000 mg | Freq: Once | INTRAMUSCULAR | Status: AC
  Administered 2012-12-30: 10 mg via INTRAVENOUS
  Filled 2012-12-30: qty 2

## 2012-12-30 MED ORDER — PROMETHAZINE HCL 25 MG/ML IJ SOLN
25.0000 mg | Freq: Once | INTRAMUSCULAR | Status: DC
  Filled 2012-12-30: qty 1

## 2012-12-30 NOTE — ED Provider Notes (Signed)
CSN: [id number]     Arrival date & time 12/30/12  0123 History   First MD Initiated Contact with Patient 12/30/12 0423     Chief Complaint  Patient presents with  . Abdominal Pain   HPI  History provided by the patient. Patient is a 32 year old female with history of hypertension, diabetes and asthma who presents with complaints of persistent mid abdominal pain and cramping. 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 She states she suddenly had a sharp cramping pain across her abdomen that caused her to double over. Since that time she has had constant pain has not improved. She has tried some over-the-counter medications for her symptoms without any relief. Using her normal prescribed medications as instructed. She denies any changes in pain with eating or drinking. She does report some associated nausea but has not had any episodes of vomiting since the pain began. She did report having several episodes of vomiting on Saturday prior to her symptoms. She denies any associated, chills or sweats. No diarrhea.      Past Medical History  Diagnosis Date  . Hypertension   . Diabetes mellitus without complication   . Asthma   . Depression   . Leg swelling    History reviewed. No pertinent past surgical history. No family history on file. History  Substance Use Topics  . Smoking status: Current Every Day Smoker  . Smokeless tobacco: Not on file  . Alcohol Use: No   OB History   Grav Para Term Preterm Abortions TAB SAB Ect Mult Living                 Review of Systems  Constitutional: Negative for fever, chills and diaphoresis.  Respiratory: Negative for shortness of breath.   Cardiovascular: Negative for chest pain.  Gastrointestinal: Positive for nausea and abdominal pain. Negative for vomiting and diarrhea.  Genitourinary: Negative for dysuria, frequency, hematuria and flank pain.  All other systems reviewed and are  negative.    Allergies  Shrimp; Other; Aspirin; Ibuprofen; Latex; Morphine and related; Penicillins; Strawberry; Tramadol; and Vicodin  Home Medications   Current Outpatient Rx  Name  Route  Sig  Dispense  Refill  . citalopram (CELEXA) 20 MG tablet   Oral   Take 20 mg by mouth daily.         . diphenhydrAMINE (BENADRYL) 25 MG tablet   Oral   Take 1 tablet (25 mg total) by mouth every 6 (six) hours.   20 tablet   0   . docusate sodium (COLACE) 100 MG capsule   Oral   Take 1 capsule (100 mg total) by mouth every 12 (twelve) hours.   60 capsule   0   . fluconazole (DIFLUCAN) 150 MG tablet   Oral   Take 1 tablet (150 mg total) by mouth once.   1 tablet   0   . furosemide (LASIX) 20 MG tablet   Oral   Take 20 mg by mouth at bedtime.         . gabapentin (NEURONTIN) 300 MG capsule   Oral   Take 300 mg by mouth at bedtime.          . insulin aspart (NOVOLOG FLEXPEN) 100 UNIT/ML SOPN FlexPen   Subcutaneous   Inject 30 Units into the skin 2 (two) times daily. If CBG is above 200, take additional 10 units-per MD.         . insulin  glargine (LANTUS) 100 UNIT/ML injection   Subcutaneous   Inject 80-100 Units into the skin 2 (two) times daily. Takes 80 units in AM, then takes 100 units in PM every day.         . lisinopril-hydrochlorothiazide (PRINZIDE,ZESTORETIC) 20-12.5 MG per tablet   Oral   Take 1 tablet by mouth 2 (two) times daily.         Marland Kitchen oxyCODONE-acetaminophen (PERCOCET/ROXICET) 5-325 MG per tablet   Oral   Take 1-2 tablets by mouth every 4 (four) hours as needed for severe pain.         . pantoprazole (PROTONIX) 20 MG tablet   Oral   Take 1 tablet (20 mg total) by mouth daily.   14 tablet   0   . polyethylene glycol powder (GLYCOLAX/MIRALAX) powder   Oral   Take 17 g by mouth daily.   255 g   0   . QUEtiapine Fumarate (SEROQUEL XR) 150 MG 24 hr tablet   Oral   Take 150 mg by mouth at bedtime.         . sitaGLIPtin (JANUVIA) 100  MG tablet   Oral   Take 100 mg by mouth at bedtime.          . sulfamethoxazole-trimethoprim (BACTRIM DS) 800-160 MG per tablet   Oral   Take 2 tablets by mouth 2 (two) times daily.   40 tablet   0    BP 140/87  Pulse 98  Temp(Src) 98.7 F (37.1 C) (Oral)  Resp 14  Ht 5\' 6"  (1.676 m)  Wt 233 lb (105.688 kg)  BMI 37.63 kg/m2  SpO2 100%  LMP 12/06/2012 Physical Exam  Nursing note and vitals reviewed. Constitutional: She is oriented to person, place, and time. She appears well-developed and well-nourished. No distress.  HENT:  Head: Normocephalic.  Cardiovascular: Normal rate and regular rhythm.   Pulmonary/Chest: Effort normal and breath sounds normal. No respiratory distress. She has no wheezes. She has no rales.  Abdominal: Soft. She exhibits no distension. There is tenderness. There is no rebound, no guarding, no CVA tenderness, no tenderness at McBurney's point and negative Murphy's sign.  Diffuse abdominal tenderness greatest in the left upper quadrant.  Musculoskeletal: Normal range of motion.  Neurological: She is alert and oriented to person, place, and time.  Skin: Skin is warm and dry.  Multiple sporadic maculopapular lesions consistent with possible insect bites across upper extremities and back.  Psychiatric: She has a normal mood and affect. Her behavior is normal.    ED Course  Procedures     COORDINATION OF CARE:  Nursing notes reviewed. Vital signs reviewed. Initial pt interview and examination performed.   Patient seen and evaluated. She does not appear in any significant pain or discomfort. No acute distress. Discussed work up plan with pt at bedside, which includes not testing in acute abdomen series. Pt agrees with plan.  Patient reports having some improvements of symptoms however still has some nausea and discomfort. Will give additional medications and IV fluids.  Labs with elevated blood sugar. No signs of DKA. Significant amounts of stool and  air on abdominal series without signs of SBO.   6:00 AM patient discussed in sign out with Irish Elders NP. She will continue to follow patient condition. Patient likely able to be discharged home. Suspect gastroparesis related to uncontrolled elevated blood sugar.   Treatment plan initiated: Medications  sodium chloride 0.9 % bolus 1,000 mL (not administered)  promethazine (PHENERGAN) injection  25 mg (not administered)  sodium chloride 0.9 % bolus 1,000 mL (0 mLs Intravenous Stopped 12/30/12 0633)  metoCLOPramide (REGLAN) injection 10 mg (10 mg Intravenous Given 12/30/12 0459)  diphenhydrAMINE (BENADRYL) injection 25 mg (25 mg Intravenous Given 12/30/12 0459)  ketorolac (TORADOL) 30 MG/ML injection 30 mg (30 mg Intravenous Given 12/30/12 0459)   Results for orders placed during the hospital encounter of 12/30/12  CBC WITH DIFFERENTIAL      Result Value Range   WBC 10.3  4.0 - 10.5 K/uL   RBC 4.51  3.87 - 5.11 MIL/uL   Hemoglobin 12.8  12.0 - 15.0 g/dL   HCT 16.1  09.6 - 04.5 %   MCV 82.5  78.0 - 100.0 fL   MCH 28.4  26.0 - 34.0 pg   MCHC 34.4  30.0 - 36.0 g/dL   RDW 40.9  81.1 - 91.4 %   Platelets 299  150 - 400 K/uL   Neutrophils Relative % 47  43 - 77 %   Neutro Abs 4.9  1.7 - 7.7 K/uL   Lymphocytes Relative 44  12 - 46 %   Lymphs Abs 4.5 (*) 0.7 - 4.0 K/uL   Monocytes Relative 7  3 - 12 %   Monocytes Absolute 0.7  0.1 - 1.0 K/uL   Eosinophils Relative 2  0 - 5 %   Eosinophils Absolute 0.2  0.0 - 0.7 K/uL   Basophils Relative 0  0 - 1 %   Basophils Absolute 0.0  0.0 - 0.1 K/uL  COMPREHENSIVE METABOLIC PANEL      Result Value Range   Sodium 133 (*) 135 - 145 mEq/L   Potassium 4.0  3.5 - 5.1 mEq/L   Chloride 96  96 - 112 mEq/L   CO2 24  19 - 32 mEq/L   Glucose, Bld 382 (*) 70 - 99 mg/dL   BUN 9  6 - 23 mg/dL   Creatinine, Ser 7.82  0.50 - 1.10 mg/dL   Calcium 9.3  8.4 - 95.6 mg/dL   Total Protein 7.5  6.0 - 8.3 g/dL   Albumin 3.7  3.5 - 5.2 g/dL   AST 15  0 - 37 U/L    ALT 15  0 - 35 U/L   Alkaline Phosphatase 80  39 - 117 U/L   Total Bilirubin 0.1 (*) 0.3 - 1.2 mg/dL   GFR calc non Af Amer >90  >90 mL/min   GFR calc Af Amer >90  >90 mL/min  LIPASE, BLOOD      Result Value Range   Lipase 33  11 - 59 U/L  POCT PREGNANCY, URINE      Result Value Range   Preg Test, Ur NEGATIVE  NEGATIVE      Imaging Review Dg Abd Acute W/chest  12/30/2012   CLINICAL DATA:  Abdominal pain, nausea and vomiting.  EXAM: ACUTE ABDOMEN SERIES (ABDOMEN 2 VIEW & CHEST 1 VIEW)  COMPARISON:  Chest 12/24/2012  FINDINGS: Normal heart size and pulmonary vascularity. Lungs appear clear without evidence of infiltration or atelectasis. No blunting of costophrenic angles. No pneumothorax. No significant change since previous study.  Gas and stool throughout the colon. No small or large bowel distention. No free intra-abdominal air. No abnormal air-fluid levels. No radiopaque stones. Visualized bones appear intact.  IMPRESSION: No evidence of active pulmonary disease. Nonobstructive bowel gas pattern.   Electronically Signed   By: Burman Nieves M.D.   On: 12/30/2012 05:26      MDM  No diagnosis found.     Angus Seller, PA-C 12/30/12 [phone]

## 2012-12-30 NOTE — ED Provider Notes (Signed)
Medical screening examination/treatment/procedure(s) were performed by non-physician practitioner and as supervising physician I was immediately available for consultation/collaboration.  Megan E Docherty, MD 12/30/12 2133 

## 2012-12-30 NOTE — ED Notes (Signed)
CBG 250 

## 2012-12-30 NOTE — ED Provider Notes (Signed)
  Physical Exam  BP 140/87  Pulse 98  Temp(Src) 98.7 F (37.1 C) (Oral)  Resp 14  Ht 5\' 6"  (1.676 m)  Wt 233 lb (105.688 kg)  BMI 37.63 kg/m2  SpO2 100%  LMP 12/06/2012  Physical Exam Abdominal exam; no guarding or rigidity. Slightly tender in LUQ. Soft, non-distended. BS positive in all four quadrants. Negative Murphy's, Negative McBurney's point.    ED Course  Procedures  MDM Abdominal pain, gastroparesis. No peritoneal signs, reassuring abdominal exam. Feeling better after IV fluids and meds given here. Wants to go home, stable for discharge. Return if symptoms worsen.        Irish Elders, NP 12/30/12 1000

## 2012-12-30 NOTE — ED Notes (Signed)
Pt. reports mid/low abdominal pain with nausea onset this morning , denies fever or chills , no emesis or diarrhea.

## 2012-12-30 NOTE — ED Provider Notes (Signed)
Medical screening examination/treatment/procedure(s) were performed by non-physician practitioner and as supervising physician I was immediately available for consultation/collaboration.  EKG Interpretation   None          Christopher J. Pollina, MD 12/30/12 1151 

## 2013-01-31 ENCOUNTER — Emergency Department (HOSPITAL_COMMUNITY): Payer: Medicaid Other

## 2013-01-31 ENCOUNTER — Emergency Department (HOSPITAL_COMMUNITY)
Admission: EM | Admit: 2013-01-31 | Discharge: 2013-01-31 | Disposition: A | Discharge status: Home / Self Care | Payer: Medicaid Other | Attending: Emergency Medicine | Admitting: Emergency Medicine

## 2013-01-31 ENCOUNTER — Encounter (HOSPITAL_COMMUNITY): Payer: Self-pay | Admitting: Emergency Medicine

## 2013-01-31 DIAGNOSIS — Z794 Long term (current) use of insulin: Secondary | ICD-10-CM | POA: Insufficient documentation

## 2013-01-31 DIAGNOSIS — F17200 Nicotine dependence, unspecified, uncomplicated: Secondary | ICD-10-CM | POA: Insufficient documentation

## 2013-01-31 DIAGNOSIS — I10 Essential (primary) hypertension: Secondary | ICD-10-CM | POA: Insufficient documentation

## 2013-01-31 DIAGNOSIS — M545 Low back pain, unspecified: Secondary | ICD-10-CM | POA: Insufficient documentation

## 2013-01-31 DIAGNOSIS — F329 Major depressive disorder, single episode, unspecified: Secondary | ICD-10-CM | POA: Insufficient documentation

## 2013-01-31 DIAGNOSIS — R209 Unspecified disturbances of skin sensation: Secondary | ICD-10-CM | POA: Insufficient documentation

## 2013-01-31 DIAGNOSIS — J111 Influenza due to unidentified influenza virus with other respiratory manifestations: Secondary | ICD-10-CM | POA: Insufficient documentation

## 2013-01-31 DIAGNOSIS — Z880 Allergy status to penicillin: Secondary | ICD-10-CM | POA: Insufficient documentation

## 2013-01-31 DIAGNOSIS — M542 Cervicalgia: Secondary | ICD-10-CM | POA: Insufficient documentation

## 2013-01-31 DIAGNOSIS — Z91040 Latex allergy status: Secondary | ICD-10-CM | POA: Insufficient documentation

## 2013-01-31 DIAGNOSIS — Z79899 Other long term (current) drug therapy: Secondary | ICD-10-CM | POA: Insufficient documentation

## 2013-01-31 DIAGNOSIS — E119 Type 2 diabetes mellitus without complications: Secondary | ICD-10-CM | POA: Insufficient documentation

## 2013-01-31 DIAGNOSIS — F3289 Other specified depressive episodes: Secondary | ICD-10-CM | POA: Insufficient documentation

## 2013-01-31 DIAGNOSIS — J45909 Unspecified asthma, uncomplicated: Secondary | ICD-10-CM | POA: Insufficient documentation

## 2013-01-31 DIAGNOSIS — G8929 Other chronic pain: Secondary | ICD-10-CM | POA: Insufficient documentation

## 2013-01-31 MED ORDER — ACETAMINOPHEN 325 MG PO TABS
325.0000 mg | ORAL_TABLET | Freq: Once | ORAL | Status: AC
  Administered 2013-01-31: 325 mg via ORAL
  Filled 2013-01-31: qty 1

## 2013-01-31 MED ORDER — OSELTAMIVIR PHOSPHATE 75 MG PO CAPS: 75.0000 mg | ORAL_CAPSULE | Freq: Two times a day (BID) | ORAL | Status: DC

## 2013-01-31 MED ORDER — OXYCODONE-ACETAMINOPHEN 5-325 MG PO TABS: 1.0000 | ORAL_TABLET | Freq: Once | ORAL | Status: DC

## 2013-01-31 MED ORDER — OXYCODONE-ACETAMINOPHEN 5-325 MG PO TABS
1.0000 | ORAL_TABLET | Freq: Once | ORAL | Status: AC
  Administered 2013-01-31: 1 via ORAL
  Filled 2013-01-31: qty 1

## 2013-01-31 NOTE — ED Notes (Signed)
Patient transported to X-ray 

## 2013-01-31 NOTE — ED Notes (Addendum)
Per EMS, Pt, from home, c/o increased, chronic, generalized back pain and increasing numbness and weakness in BUE x 2 days.  Pain score 10/10.  Denies injury.  Sts "I was just sitting in class, when it started."  EMS reports Pt had no difficulty moving arms en route.  Pt is texting on her phone w/o difficulty.

## 2013-01-31 NOTE — ED Provider Notes (Signed)
CSN: [id number]     Arrival date & time 01/31/13  1519 History   First MD Initiated Contact with Patient 01/31/13 1542     Chief Complaint  Patient presents with  . Back Pain  . Numbness   (Consider location/radiation/quality/duration/timing/severity/associated sxs/prior Treatment) HPI Comments: Patient is 32 year old female who sees the [facility] and is here with worsening of her chronic back pain - she reports pain from her neck all the way down her back - she states that "they are not doing anything for me".  She states that she ran out of her oxycodone for pain and this has exacerbated the pain.  She also states that bilateral shoulders are hurting but she denies weakness but reports numbness to her bilateral arms and hands.  She denies radiation of the lower back pain - reports no x-rays ordered in the past.  She denies weakness, loss of control of bowels or bladder, saddle anesthesia.  Patient is a 32 y.o. female presenting with back pain. The history is provided by the patient. No language interpreter was used.  Back Pain Location:  Generalized Quality:  Stiffness and shooting Stiffness is present:  All day Radiates to:  L shoulder and R shoulder Pain severity:  Severe Pain is:  Same all the time Onset quality:  Gradual Duration:  6 months Timing:  Constant Progression:  Worsening Chronicity:  Chronic Context: not falling, not jumping from heights, not lifting heavy objects, not MCA, not MVA, not physical stress and not twisting   Relieved by:  Nothing Worsened by:  Nothing tried Ineffective treatments:  None tried Associated symptoms: numbness, paresthesias and tingling   Associated symptoms: no abdominal pain, no bladder incontinence, no bowel incontinence, no dysuria, no fever, no leg pain and no weakness     Past Medical History  Diagnosis Date  . Hypertension   . Diabetes mellitus without complication   . Asthma   . Depression   . Leg swelling    History  reviewed. No pertinent past surgical history. History reviewed. No pertinent family history. History  Substance Use Topics  . Smoking status: Current Every Day Smoker  . Smokeless tobacco: Not on file  . Alcohol Use: No   OB History   Grav Para Term Preterm Abortions TAB SAB Ect Mult Living                 Review of Systems  Constitutional: Negative for fever.  Gastrointestinal: Negative for abdominal pain and bowel incontinence.  Genitourinary: Negative for bladder incontinence and dysuria.  Musculoskeletal: Positive for back pain.  Neurological: Positive for tingling, numbness and paresthesias. Negative for weakness.  All other systems reviewed and are negative.    Allergies  Shrimp; Other; Aspirin; Ibuprofen; Latex; Morphine and related; Penicillins; Strawberry; Tramadol; and Vicodin  Home Medications   Current Outpatient Rx  Name  Route  Sig  Dispense  Refill  . albuterol (PROVENTIL HFA;VENTOLIN HFA) 108 (90 BASE) MCG/ACT inhaler   Inhalation   Inhale 2 puffs into the lungs every 6 (six) hours as needed for wheezing or shortness of breath.         . citalopram (CELEXA) 20 MG tablet   Oral   Take 20 mg by mouth daily.         . furosemide (LASIX) 20 MG tablet   Oral   Take 20 mg by mouth at bedtime.         . insulin aspart (NOVOLOG FLEXPEN) 100 UNIT/ML  SOPN FlexPen   Subcutaneous   Inject 30 Units into the skin 2 (two) times daily. If CBG is above 200, take additional 10 units-per MD.         . insulin glargine (LANTUS) 100 UNIT/ML injection   Subcutaneous   Inject 80-100 Units into the skin 2 (two) times daily. Takes 80 units in AM, then takes 100 units in PM every day.         . lisinopril-hydrochlorothiazide (PRINZIDE,ZESTORETIC) 20-12.5 MG per tablet   Oral   Take 1 tablet by mouth 2 (two) times daily.         . ondansetron (ZOFRAN) 4 MG tablet   Oral   Take 1 tablet (4 mg total) by mouth every 6 (six) hours.   12 tablet   0   .  oxyCODONE-acetaminophen (PERCOCET) 10-325 MG per tablet   Oral   Take 1 tablet by mouth every 4 (four) hours as needed for pain.         . pantoprazole (PROTONIX) 20 MG tablet   Oral   Take 1 tablet (20 mg total) by mouth daily.   14 tablet   0   . polyethylene glycol powder (GLYCOLAX/MIRALAX) powder   Oral   Take 17 g by mouth daily.   255 g   0   . QUEtiapine Fumarate (SEROQUEL XR) 150 MG 24 hr tablet   Oral   Take 150 mg by mouth at bedtime as needed (bipolar disorder.).          Marland Kitchen sitaGLIPtin (JANUVIA) 100 MG tablet   Oral   Take 100 mg by mouth at bedtime.           BP 132/81  Pulse 109  Temp(Src) 99.9 F (37.7 C) (Oral)  Resp 18  SpO2 98%  LMP 01/03/2013 Physical Exam  Nursing note and vitals reviewed. Constitutional: She is oriented to person, place, and time. She appears well-developed and well-nourished. No distress.  HENT:  Head: Normocephalic and atraumatic.  Right Ear: External ear normal.  Left Ear: External ear normal.  Nose: Nose normal.  Mouth/Throat: Oropharynx is clear and moist. No oropharyngeal exudate.  Eyes: Conjunctivae are normal. Pupils are equal, round, and reactive to light. No scleral icterus.  Neck: Normal range of motion. Neck supple. Spinous process tenderness and muscular tenderness present.    Cardiovascular: Normal rate, regular rhythm and normal heart sounds.  Exam reveals no gallop and no friction rub.   No murmur heard. Pulmonary/Chest: Effort normal and breath sounds normal. No respiratory distress. She has no wheezes. She has no rales. She exhibits no tenderness.  Abdominal: Soft. Bowel sounds are normal. She exhibits no distension. There is no tenderness.  Musculoskeletal:       Thoracic back: She exhibits tenderness and bony tenderness. She exhibits normal range of motion.       Lumbar back: She exhibits tenderness and bony tenderness. She exhibits normal range of motion.       Back:  Lymphadenopathy:    She has no  cervical adenopathy.  Neurological: She is alert and oriented to person, place, and time. She has normal reflexes. She exhibits normal muscle tone. Coordination normal.  Skin: Skin is warm and dry. No rash noted. No erythema. No pallor.  Psychiatric: She has a normal mood and affect. Her behavior is normal. Judgment and thought content normal.    ED Course  Procedures (including critical care time) Labs Review Labs Reviewed - No data to display Imaging Review  No results found.  EKG Interpretation   None      Results for orders placed during the hospital encounter of 12/30/12  CBC WITH DIFFERENTIAL      Result Value Range   WBC 10.3  4.0 - 10.5 K/uL   RBC 4.51  3.87 - 5.11 MIL/uL   Hemoglobin 12.8  12.0 - 15.0 g/dL   HCT 16.1  09.6 - 04.5 %   MCV 82.5  78.0 - 100.0 fL   MCH 28.4  26.0 - 34.0 pg   MCHC 34.4  30.0 - 36.0 g/dL   RDW 40.9  81.1 - 91.4 %   Platelets 299  150 - 400 K/uL   Neutrophils Relative % 47  43 - 77 %   Neutro Abs 4.9  1.7 - 7.7 K/uL   Lymphocytes Relative 44  12 - 46 %   Lymphs Abs 4.5 (*) 0.7 - 4.0 K/uL   Monocytes Relative 7  3 - 12 %   Monocytes Absolute 0.7  0.1 - 1.0 K/uL   Eosinophils Relative 2  0 - 5 %   Eosinophils Absolute 0.2  0.0 - 0.7 K/uL   Basophils Relative 0  0 - 1 %   Basophils Absolute 0.0  0.0 - 0.1 K/uL  COMPREHENSIVE METABOLIC PANEL      Result Value Range   Sodium 133 (*) 135 - 145 mEq/L   Potassium 4.0  3.5 - 5.1 mEq/L   Chloride 96  96 - 112 mEq/L   CO2 24  19 - 32 mEq/L   Glucose, Bld 382 (*) 70 - 99 mg/dL   BUN 9  6 - 23 mg/dL   Creatinine, Ser 7.82  0.50 - 1.10 mg/dL   Calcium 9.3  8.4 - 95.6 mg/dL   Total Protein 7.5  6.0 - 8.3 g/dL   Albumin 3.7  3.5 - 5.2 g/dL   AST 15  0 - 37 U/L   ALT 15  0 - 35 U/L   Alkaline Phosphatase 80  39 - 117 U/L   Total Bilirubin 0.1 (*) 0.3 - 1.2 mg/dL   GFR calc non Af Amer >90  >90 mL/min   GFR calc Af Amer >90  >90 mL/min  LIPASE, BLOOD      Result Value Range   Lipase 33  11  - 59 U/L  GLUCOSE, CAPILLARY      Result Value Range   Glucose-Capillary 250 (*) 70 - 99 mg/dL  POCT PREGNANCY, URINE      Result Value Range   Preg Test, Ur NEGATIVE  NEGATIVE   Dg Cervical Spine Complete  01/31/2013   CLINICAL DATA:  Neck pain.  No injury.  EXAM: CERVICAL SPINE  4+ VIEWS  COMPARISON:  None.  FINDINGS: There is no evidence of cervical spine fracture or prevertebral soft tissue swelling. Alignment is normal. No other significant bone abnormalities are identified.  IMPRESSION: Negative cervical spine radiographs.   Electronically Signed   By: Sherian Rein M.D.   On: 01/31/2013 16:35   Dg Thoracic Spine 2 View  01/31/2013   CLINICAL DATA:  Upper back pain, no injury  EXAM: THORACIC SPINE - 2 VIEW  COMPARISON:  None.  FINDINGS: There is no evidence of thoracic spine fracture. There is scoliosis of spine. There is minimal degenerative joint changes of the mid thoracic spine.  IMPRESSION: No acute fracture or dislocation.   Electronically Signed   By: Sherian Rein M.D.   On: 01/31/2013 16:37  Dg Lumbar Spine Complete  01/31/2013   CLINICAL DATA:  Low back pain, no injury  EXAM: LUMBAR SPINE - COMPLETE 4+ VIEW  COMPARISON:  None.  FINDINGS: There is no evidence of lumbar spine fracture. Alignment is normal. There is minimal anterior osteophytosis in the mid lumbar spine.  IMPRESSION: No acute fracture or dislocation.   Electronically Signed   By: Sherian Rein M.D.   On: 01/31/2013 16:37      MDM  Chronic lower back pain Influenza  Patient here with exacerbation of chronic neck and lower back pain - is out of her medications - no alarming signs, clinically stable - also noted with fever here... She reports no recent sick contacts, but reports dry and hacking cough, body aches.  No nausea, vomiting, abdominal pain.  Likely influenza    Izola Price. Marisue Humble, PA-C 01/31/13 1732

## 2013-01-31 NOTE — ED Provider Notes (Signed)
Medical screening examination/treatment/procedure(s) were performed by non-physician practitioner and as supervising physician I was immediately available for consultation/collaboration.    Nelia Shi, MD 01/31/13 [phone]

## 2013-01-31 NOTE — ED Notes (Signed)
Pt in rest room. Medication delayed

## 2013-02-27 ENCOUNTER — Encounter (HOSPITAL_COMMUNITY): Payer: Self-pay | Admitting: Emergency Medicine

## 2013-02-27 ENCOUNTER — Emergency Department (HOSPITAL_COMMUNITY)
Admission: EM | Admit: 2013-02-27 | Discharge: 2013-02-27 | Disposition: A | Discharge status: Home / Self Care | Payer: Medicaid Other | Attending: Emergency Medicine | Admitting: Emergency Medicine

## 2013-02-27 DIAGNOSIS — Z794 Long term (current) use of insulin: Secondary | ICD-10-CM | POA: Insufficient documentation

## 2013-02-27 DIAGNOSIS — J45909 Unspecified asthma, uncomplicated: Secondary | ICD-10-CM | POA: Insufficient documentation

## 2013-02-27 DIAGNOSIS — Z9889 Other specified postprocedural states: Secondary | ICD-10-CM | POA: Insufficient documentation

## 2013-02-27 DIAGNOSIS — Z79899 Other long term (current) drug therapy: Secondary | ICD-10-CM | POA: Insufficient documentation

## 2013-02-27 DIAGNOSIS — F329 Major depressive disorder, single episode, unspecified: Secondary | ICD-10-CM | POA: Insufficient documentation

## 2013-02-27 DIAGNOSIS — Z792 Long term (current) use of antibiotics: Secondary | ICD-10-CM | POA: Insufficient documentation

## 2013-02-27 DIAGNOSIS — F3289 Other specified depressive episodes: Secondary | ICD-10-CM | POA: Insufficient documentation

## 2013-02-27 DIAGNOSIS — R21 Rash and other nonspecific skin eruption: Secondary | ICD-10-CM

## 2013-02-27 DIAGNOSIS — F17200 Nicotine dependence, unspecified, uncomplicated: Secondary | ICD-10-CM | POA: Insufficient documentation

## 2013-02-27 DIAGNOSIS — Z91040 Latex allergy status: Secondary | ICD-10-CM | POA: Insufficient documentation

## 2013-02-27 DIAGNOSIS — Z880 Allergy status to penicillin: Secondary | ICD-10-CM | POA: Insufficient documentation

## 2013-02-27 DIAGNOSIS — E119 Type 2 diabetes mellitus without complications: Secondary | ICD-10-CM | POA: Insufficient documentation

## 2013-02-27 DIAGNOSIS — I10 Essential (primary) hypertension: Secondary | ICD-10-CM | POA: Insufficient documentation

## 2013-02-27 MED ORDER — MUPIROCIN CALCIUM 2 % EX CREA: 1.0000 "application " | TOPICAL_CREAM | Freq: Two times a day (BID) | CUTANEOUS | Status: AC

## 2013-02-27 MED ORDER — CLINDAMYCIN HCL 150 MG PO CAPS: 300.0000 mg | ORAL_CAPSULE | Freq: Four times a day (QID) | ORAL | Status: AC

## 2013-02-27 NOTE — ED Notes (Signed)
Pt reports she had bed bug on her abdomen and back three months ago, pt reports she has been placing a cream on it since and it had improved. Pt reports she has an itchy and painful rash on her buttocks and states she is very uncomfortable. Pt reports she has a mole on her belly button that is bleeding and is painful also.

## 2013-02-27 NOTE — ED Provider Notes (Signed)
Medical screening examination/treatment/procedure(s) were performed by non-physician practitioner and as supervising physician I was immediately available for consultation/collaboration.   Dione Boozeavid Johan Antonacci, MD 02/27/13 [phone]

## 2013-02-27 NOTE — ED Notes (Signed)
PA at BS.  

## 2013-02-27 NOTE — Discharge Instructions (Signed)
Please read and follow all provided instructions.  Your diagnoses today include:  1. Rash     Tests performed today include:  Vital signs. See below for your results today.   Medications prescribed:   Clindamycin - antibiotic for skin infection  You have been prescribed an antibiotic medicine: take the entire course of medicine even if you are feeling better. Stopping early can cause the antibiotic not to work.   Bactroban ointment - antibiotic ointment. Apply to any sore areas on your buttocks.  Take any prescribed medications only as directed.   Home care instructions:  Follow any educational materials contained in this packet. Keep affected area above the level of your heart when possible. Wash area gently twice a day with warm soapy water. Do not apply alcohol or hydrogen peroxide. Cover the area if it draining or weeping.   Follow-up instructions: Please follow-up with your primary care provider in the next 1 week for further evaluation of your symptoms. If you do not have a primary care doctor -- see below for referral information.   Return instructions:  Return to the Emergency Department if you have:  Fever  Worsening symptoms  Worsening pain  Worsening swelling  Redness of the skin that moves away from the affected area, especially if it streaks away from the affected area   Any other emergent concerns  Your vital signs today were: BP 145/90   Pulse 96   Temp(Src) 98.5 F (36.9 C) (Oral)   Resp 20   SpO2 98%   LMP 01/03/2013 If your blood pressure (BP) was elevated above 135/85 this visit, please have this repeated by your doctor within one month. --------------  Emergency Department Resource Guide 1) Find a Doctor and Pay Out of Pocket Although you won't have to find out who is covered by your insurance plan, it is a good idea to ask around and get recommendations. You will then need to call the office and see if the doctor you have chosen will accept you  as a new patient and what types of options they offer for patients who are self-pay. Some doctors offer discounts or will set up payment plans for their patients who do not have insurance, but you will need to ask so you aren't surprised when you get to your appointment.  2) Contact Your Local Health Department Not all health departments have doctors that can see patients for sick visits, but many do, so it is worth a call to see if yours does. If you don't know where your local health department is, you can check in your phone book. The CDC also has a tool to help you locate your state's health department, and many state websites also have listings of all of their local health departments.  3) Find a Walk-in Clinic If your illness is not likely to be very severe or complicated, you may want to try a walk in clinic. These are popping up all over the country in pharmacies, drugstores, and shopping centers. They're usually staffed by nurse practitioners or physician assistants that have been trained to treat common illnesses and complaints. They're usually fairly quick and inexpensive. However, if you have serious medical issues or chronic medical problems, these are probably not your best option.  No Primary Care Doctor: - Call Health Connect at  [phone] - they can help you locate a primary care doctor that  accepts your insurance, provides certain services, etc. - Physician Referral Service- [phone]  Chronic Pain Problems:  Organization         Address  Phone   Notes  [facility]  [phone] Patients need to be referred by their primary care doctor.   Medication Assistance: Organization         Address  Phone   Notes  [facility] Medication [facility] [address] [place] [place], [place] [address] [phone] --Must be a resident of [facility] -- Must have NO insurance coverage whatsoever (no Medicaid/ Medicare, etc.) -- The pt. MUST have  a primary care doctor that directs their care regularly and follows them in the community   [facility]ist  [phone]   [employer]  [phone]    Agencies that provide inexpensive medical care: Organization         Address  Phone   Notes  [facility]  [phone]   [facility]    [phone]   [facility] [address] [place], [place] [address] [phone]   [facility] [address] [place], [place] [phone]   [facility]anned [facility]    [phone]   [facility]    [phone]   [facility]  [address], [place] Phone:  [phone], Fax:  [phone] Hours of Operation:  9 am - 6 pm, M-F.  Also accepts Medicaid/Medicare and self-pay.  [facility]  [address], [place] Phone: [phone], Fax: [phone]. Hours of Operation:  8:30 am - 5:30 pm, M-F.  Also accepts Medicaid and self-pay.  [facility] [place] [address], [place] Phone: [phone]   [facility] [address] Natasha Bence [place], [place] [phone], Ext. [phone] Mondays & Thursdays: 7-9 AM.  First 15 patients are seen on a first come, first serve basis.    Medicaid-accepting [facility] Providers:  Organization         Address  Phone   Notes  [facility] [address], [place] [phone] Also accepts self-pay patients.  [facility] [address] Laurell Josephs [place], [place]  [phone]   [facility] [address], [place] [phone]   [facility] [address], [place] [phone]   Renaye Rakers [address], [place]   [phone] Only accepts [place] Access [place] patients after they have their name applied to their card.   Self-Pay (no insurance) in [facility]:  Organization         Address  Phone   Notes  Sickle Cell Patients, [facility] Internal Medicine [address] [place], [place] [phone]   [facility] [address] [place], [place] [phone]   [facility] [place]  [address], [place] [phone]   [facility]/Dr. Osei-Bonsu  [address] [place] or [address], [place] [phone] Phone number for both [place] and [place] locations is the same.  Urgent Medical and [facility] [address], [place] [phone]   [facility] [address], [place] or [address] [phone]   [facility] [address], [place] [phone], phone; [phone], fax Sees patients 1st and  3rd Saturday of every month.  Must not qualify for public or private insurance (i.e. Medicaid, Medicare, [place] Health Choice, Veterans' Benefits)  Household income should be no more than 200% of the poverty level The clinic cannot treat you if you are pregnant or think you are pregnant  Sexually transmitted diseases are not treated at the clinic.    Dental Care: Organization         Address  Phone  Notes  [facility] Department of [facility] [address] [place], [place] [phone] Accepts children up to age 20 who are enrolled in [place] or [place] Health Choice; pregnant women with a Medicaid card; and children who have applied for Medicaid or [place] Health Choice, but were declined, whose parents can pay a reduced fee at time of service.  [facility] Department of [facility]  [address], [place] [phone] Accepts children up to age 44 who are enrolled in [place] or [place] Health Choice; pregnant women with a Medicaid card; and children who have applied for Medicaid or [place] Health Choice, but were declined, whose parents can  pay a reduced fee at time of service.  [facility]  [address] [place], [place] [phone] Patients are seen by appointment only. Walk-ins are not accepted. [facility] will see patients 70 years of age and older. Monday - Tuesday (8am-5pm) Most Wednesdays (8:30-5pm) $30 per visit, cash only  [facility] Adult Dental Access PROGRAM  [address], [facility] [phone] Patients are seen by appointment only. Walk-ins are not accepted. [facility] will see patients 26 years of age and older. One Wednesday Evening (Monthly: Volunteer Based).  $30 per visit, cash only  [employer]  [phone] for adults; Children under age 87, call Graduate Pediatric Dentistry at [phone]. Children aged 23-14, please call [phone] to request a pediatric application.  Dental services are provided in all areas of dental care including fillings, crowns and bridges, complete and partial dentures, implants, gum treatment, root canals, and extractions. Preventive care is also provided. Treatment is provided to both adults and children. Patients are selected via a lottery and there is often a waiting list.   [facility] [address], [place]  [phone] [email or web address]   [facility] [address] [place], [place] [phone], Ext. [phone] Second and Fourth Thursday of each month, opens at 6:30 AM; Clinic ends at 9 AM.  Patients are seen on a first-come first-served basis, and a limited number are seen during each clinic.   [facility]  [address] Ether Griffins [place], [place] [phone]   Eligibility Requirements You must have lived in [place], [place], or [place] counties for at least the last three months.   You cannot be eligible for state or federal sponsored [place], including [employer], [place], or [employer].   You generally cannot be eligible for healthcare  insurance through your employer.    How to apply: Eligibility screenings are held every Tuesday and Wednesday afternoon from 1:00 pm until 4:00 pm. You do not need an appointment for the interview!  [facility] [address], [place], [place] [phone]   [facility] Health Department  [phone]   [facility] Health Department  [phone]   [facility] Health Department  [phone]    Behavioral Health Resources in the Community: Intensive Outpatient Programs Organization  Address  Phone  Notes  [place] [address], [place], [place] [phone]   [facility] [address] [place], [place]   [facility]: [facility] [address] [place], [place]   [place] [address],  [place], [place] or [phone]   Substance Abuse Resources Organization         Address  Phone  Notes  [facility] Drug Services  [phone]   [place]  [phone]   [facility] [place]   Chinita Pester  [phone]   Residential & Outpatient Substance Abuse Program  [phone]   Psychological Services Organization         Address  Phone  Notes  [facility] [place]  [place]  [phone]   [place] [address] [place] or [phone]    Mobile Crisis Teams Organization         Address  Phone  Notes  [facility], Mobile Crisis Care Unit  [phone]   [facility]  [address] , [place]   [place] Levels [address] [place] [phone]    Self-Help/Support Groups Organization         Address  Phone             Notes  [place] of [place] - variety of support groups  [place] Call for more information  Narcotics Anonymous (NA),  [facility] [address], [employer] [place]  2 meetings at this location   Special educational needs teacher         Address  Phone  Notes  [facility] [place],    [place]  [phone]   [facility]  [address] [place] [phone], [place], [place]   [place], [place] [phone] Admissions: 8am-3pm M-F  Incentives Substance [place] [address],    [place], [place] [phone]   [facility] [address] [place], [place], [place]   [facility] [address],  [place], [place]   [facility] - Intensive Outpatient [address] Kristeen [address], [place], [place]   [facility] ([place]  [place], [place] [phone] or [phone]   [facility] ([facility]) [address], [place], [place] Accepts Medicaid  [facility] [place] [address]  [place] [phone] Substance Abuse/Addiction Treatment   [facility] Organization         Address  Phone  Notes  [facility]  [phone]   Domenic Schwab, PhD [address] Arlis Porta [place], [place]   [phone] or [phone]   [place], [place] [phone]   [place] [address], [place], [place] [phone] Insurance/Medicaid/sponsorship through [facility] and Families [address], [address] [place]                                    [place], [place] [phone] [place] [address], [place] [phone]    Dr. Adele Schilder  [phone]   [facility]  [facility] Dept. 1) [address], [place] 2) [address], [place] 3)  [address], [place] [phone]  [phone]    [facility] Child Abuse Hotline (934)027-1346 or [phone] (After Hours)

## 2013-02-27 NOTE — ED Provider Notes (Signed)
CSN: [id number]     Arrival date & time 02/27/13  [id number] History   First MD Initiated Contact with Patient 02/27/13 [phone]     Chief Complaint  Patient presents with  . Rash   (Consider location/radiation/quality/duration/timing/severity/associated sxs/prior Treatment) HPI Comments: Patient presents with complaint of itchy bumps on buttocks x 3 months that she scratches which then become painful. No treatments PTA. She is concerned they are bed bug bites. She had not noted drainage or pus, but does state they bleed after she scratches them. No fever, N/V.   She also c/o bleeding from umbilical scar where previous laparoscopy was performed due to 'cysts'. This bleeding started yesterday after she washed the area.  No pus or clear fluid from the area. No significant abdominal pain. No current bleeding. She forms keloids but denies keloid in this area.   Patient is a 33 y.o. female presenting with rash. The history is provided by the patient.  Rash Associated symptoms: no abdominal pain, no diarrhea, no fever, no headaches, no myalgias, no nausea, no sore throat and not vomiting     Past Medical History  Diagnosis Date  . Hypertension   . Diabetes mellitus without complication   . Asthma   . Depression   . Leg swelling    History reviewed. No pertinent past surgical history. No family history on file. History  Substance Use Topics  . Smoking status: Current Every Day Smoker  . Smokeless tobacco: Not on file  . Alcohol Use: No   OB History   Grav Para Term Preterm Abortions TAB SAB Ect Mult Living                 Review of Systems  Constitutional: Negative for fever.  HENT: Negative for rhinorrhea and sore throat.   Eyes: Negative for redness.  Respiratory: Negative for cough.   Cardiovascular: Negative for chest pain.  Gastrointestinal: Negative for nausea, vomiting, abdominal pain and diarrhea.  Genitourinary: Negative for dysuria.  Musculoskeletal: Negative for myalgias.   Skin: Positive for rash. Negative for color change.  Neurological: Negative for headaches.    Allergies  Shrimp; Other; Aspirin; Ibuprofen; Latex; Morphine and related; Penicillins; Strawberry; Tramadol; and Vicodin  Home Medications   Current Outpatient Rx  Name  Route  Sig  Dispense  Refill  . albuterol (PROVENTIL HFA;VENTOLIN HFA) 108 (90 BASE) MCG/ACT inhaler   Inhalation   Inhale 2 puffs into the lungs every 6 (six) hours as needed for wheezing or shortness of breath.         . citalopram (CELEXA) 20 MG tablet   Oral   Take 20 mg by mouth daily.         . clindamycin (CLEOCIN) 150 MG capsule   Oral   Take 2 capsules (300 mg total) by mouth every 6 (six) hours.   56 capsule   0   . furosemide (LASIX) 20 MG tablet   Oral   Take 20 mg by mouth at bedtime.         . insulin aspart (NOVOLOG FLEXPEN) 100 UNIT/ML SOPN FlexPen   Subcutaneous   Inject 30 Units into the skin 2 (two) times daily. If CBG is above 200, take additional 10 units-per MD.         . insulin glargine (LANTUS) 100 UNIT/ML injection   Subcutaneous   Inject 80-100 Units into the skin 2 (two) times daily. Takes 80 units in AM, then takes 100 units in PM every day.         .Marland Kitchen  lisinopril-hydrochlorothiazide (PRINZIDE,ZESTORETIC) 20-12.5 MG per tablet   Oral   Take 1 tablet by mouth 2 (two) times daily.         . mupirocin cream (BACTROBAN) 2 %   Topical   Apply 1 application topically 2 (two) times daily.   15 g   0   . ondansetron (ZOFRAN) 4 MG tablet   Oral   Take 1 tablet (4 mg total) by mouth every 6 (six) hours.   12 tablet   0   . oseltamivir (TAMIFLU) 75 MG capsule   Oral   Take 1 capsule (75 mg total) by mouth every 12 (twelve) hours.   10 capsule   0   . oxyCODONE-acetaminophen (PERCOCET) 10-325 MG per tablet   Oral   Take 1 tablet by mouth every 4 (four) hours as needed for pain.         Marland Kitchen oxyCODONE-acetaminophen (PERCOCET/ROXICET) 5-325 MG per tablet   Oral    Take 1 tablet by mouth once.   10 tablet   0   . pantoprazole (PROTONIX) 20 MG tablet   Oral   Take 1 tablet (20 mg total) by mouth daily.   14 tablet   0   . polyethylene glycol powder (GLYCOLAX/MIRALAX) powder   Oral   Take 17 g by mouth daily.   255 g   0   . QUEtiapine Fumarate (SEROQUEL XR) 150 MG 24 hr tablet   Oral   Take 150 mg by mouth at bedtime as needed (bipolar disorder.).          Marland Kitchen sitaGLIPtin (JANUVIA) 100 MG tablet   Oral   Take 100 mg by mouth at bedtime.           BP 145/90  Pulse 96  Temp(Src) 98.5 F (36.9 C) (Oral)  Resp 20  SpO2 98%  LMP 01/03/2013 Physical Exam  Nursing note and vitals reviewed. Constitutional: She appears well-developed and well-nourished.  HENT:  Head: Normocephalic and atraumatic.  Eyes: Conjunctivae are normal. Right eye exhibits no discharge. Left eye exhibits no discharge.  Neck: Normal range of motion. Neck supple.  Cardiovascular: Normal rate, regular rhythm and normal heart sounds.   Pulmonary/Chest: Effort normal and breath sounds normal.  Abdominal: Soft. There is no tenderness.  Old umbilical scar noted inferior aspect of umbilicus. No significant tenderness. No fluid of any kind able to be expressed from this area. No redness or cellulitis. No abscess.   Neurological: She is alert.  Skin: Skin is warm and dry.  Small papules scattered over bilateral buttocks with excoriations. No abscess or drainage. No active bleeding.   Psychiatric: She has a normal mood and affect.    ED Course  Procedures (including critical care time) Labs Review Labs Reviewed - No data to display Imaging Review No results found.  EKG Interpretation   None      6:53 AM Patient seen and examined.   Vital signs reviewed and are as follows: Filed Vitals:   02/27/13 0617  BP: 145/90  Pulse: 96  Temp: 98.5 F (36.9 C)  Resp: 20   Will treat possible folliculitis type rash with clinda and bactroban ointment.   Pt urged  to return with worsening pain, worsening swelling, expanding area of redness or streaking up extremity, fever, or any other concerns. Urged to take complete course of antibiotics as prescribed. Pt verbalizes understanding and agrees with plan.  MDM   1. Rash    Rash on buttocks not impressive. Likely  folliculitis in nature. No definite superinfection. Tx with antibiotics and Bactroban. No abscess that requires drainage.   Abdominal scar -- no active drainage, no signs of peritonitis. Fistula considered but as there is no current drainage or tenderness, do not feel imaging appropriate at current time. No cellulitis. No abscess. Patient to monitor area closely and return with worsening.   Renne Crigler, PA-C 02/27/13 [phone]

## 2014-09-26 IMAGING — CR DG CHEST 2V
2 series · 2 of 2 positions shown · non-contrast
Comparison: None.

CLINICAL DATA: Shortness of breath

CHEST - 2 VIEW

[w chest pa]
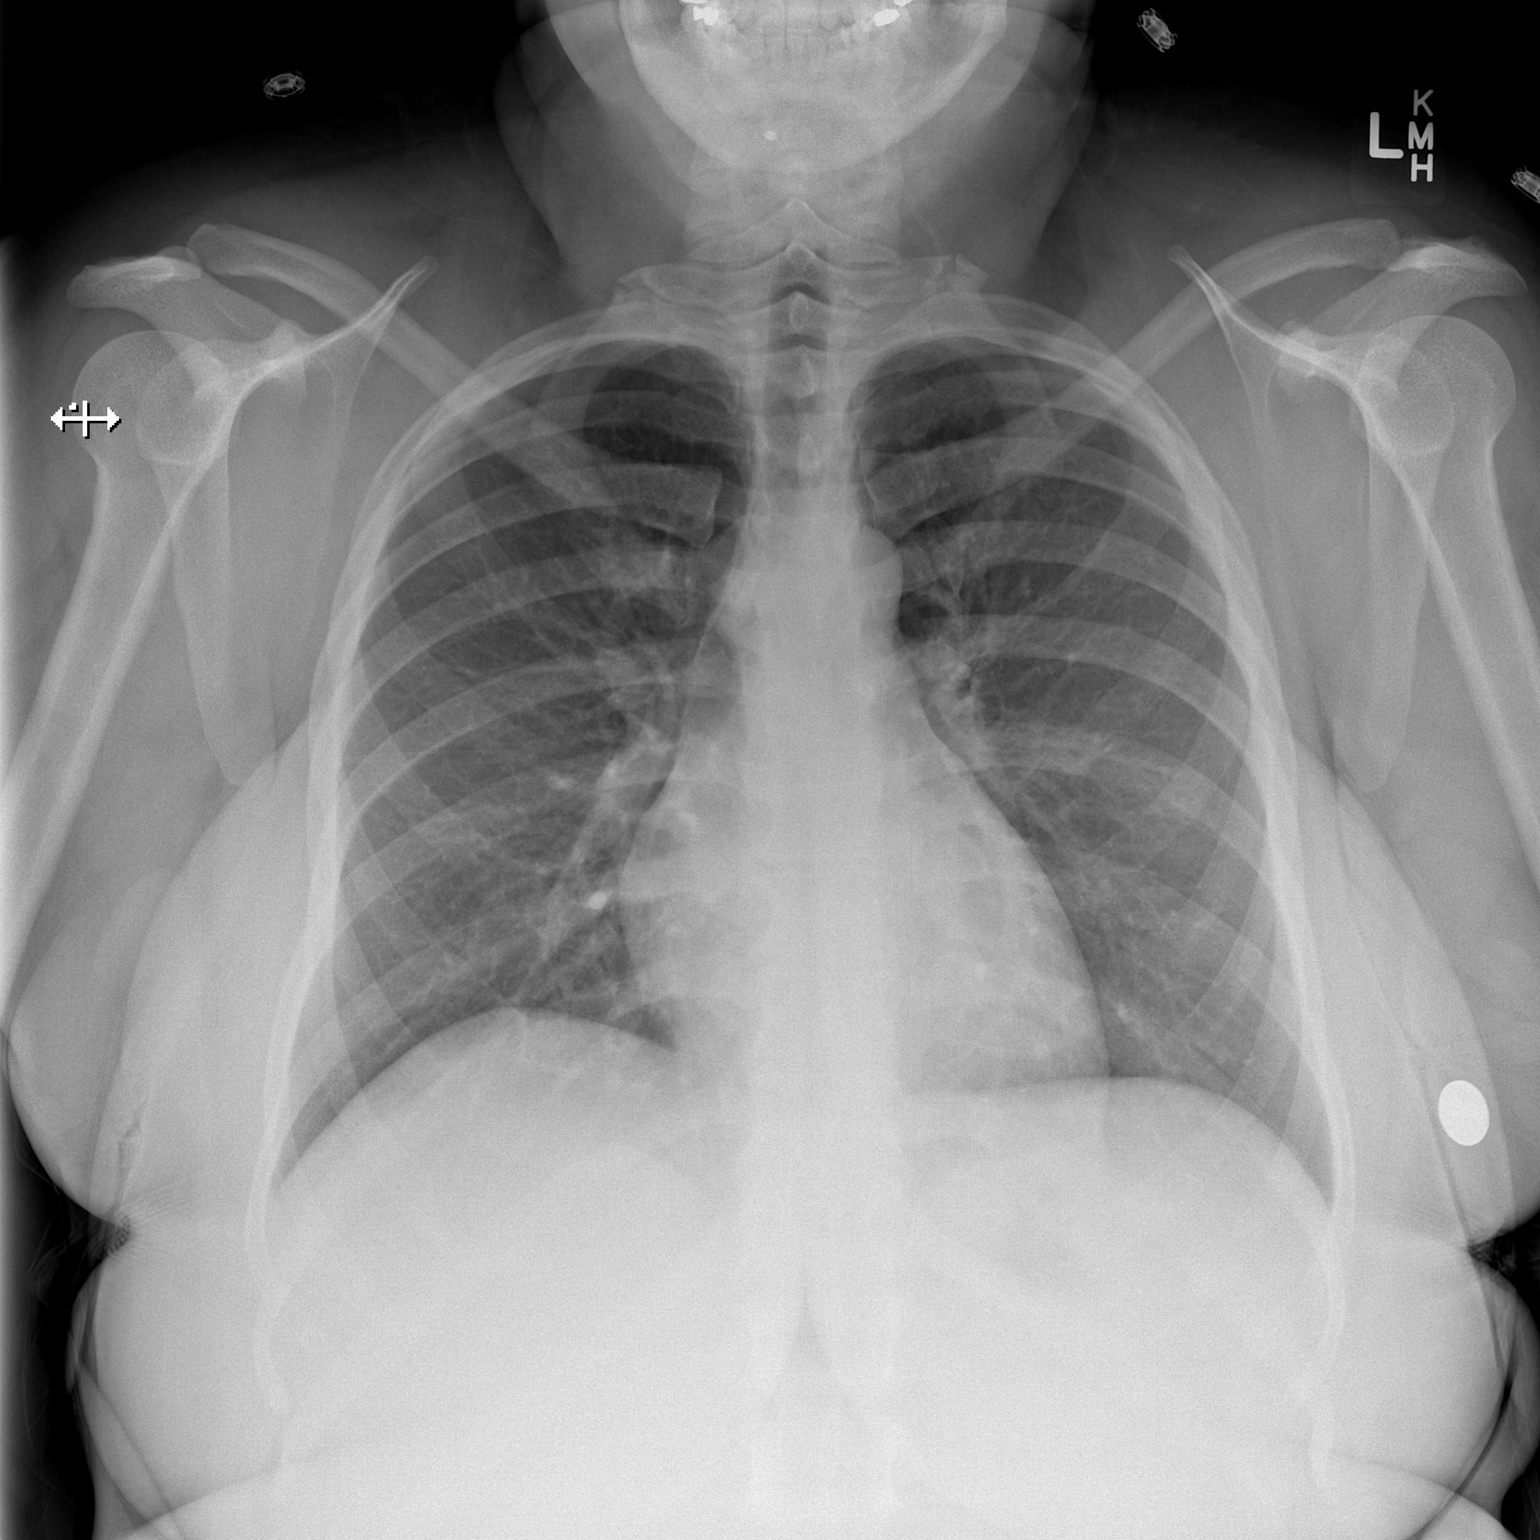

[w chest lat]
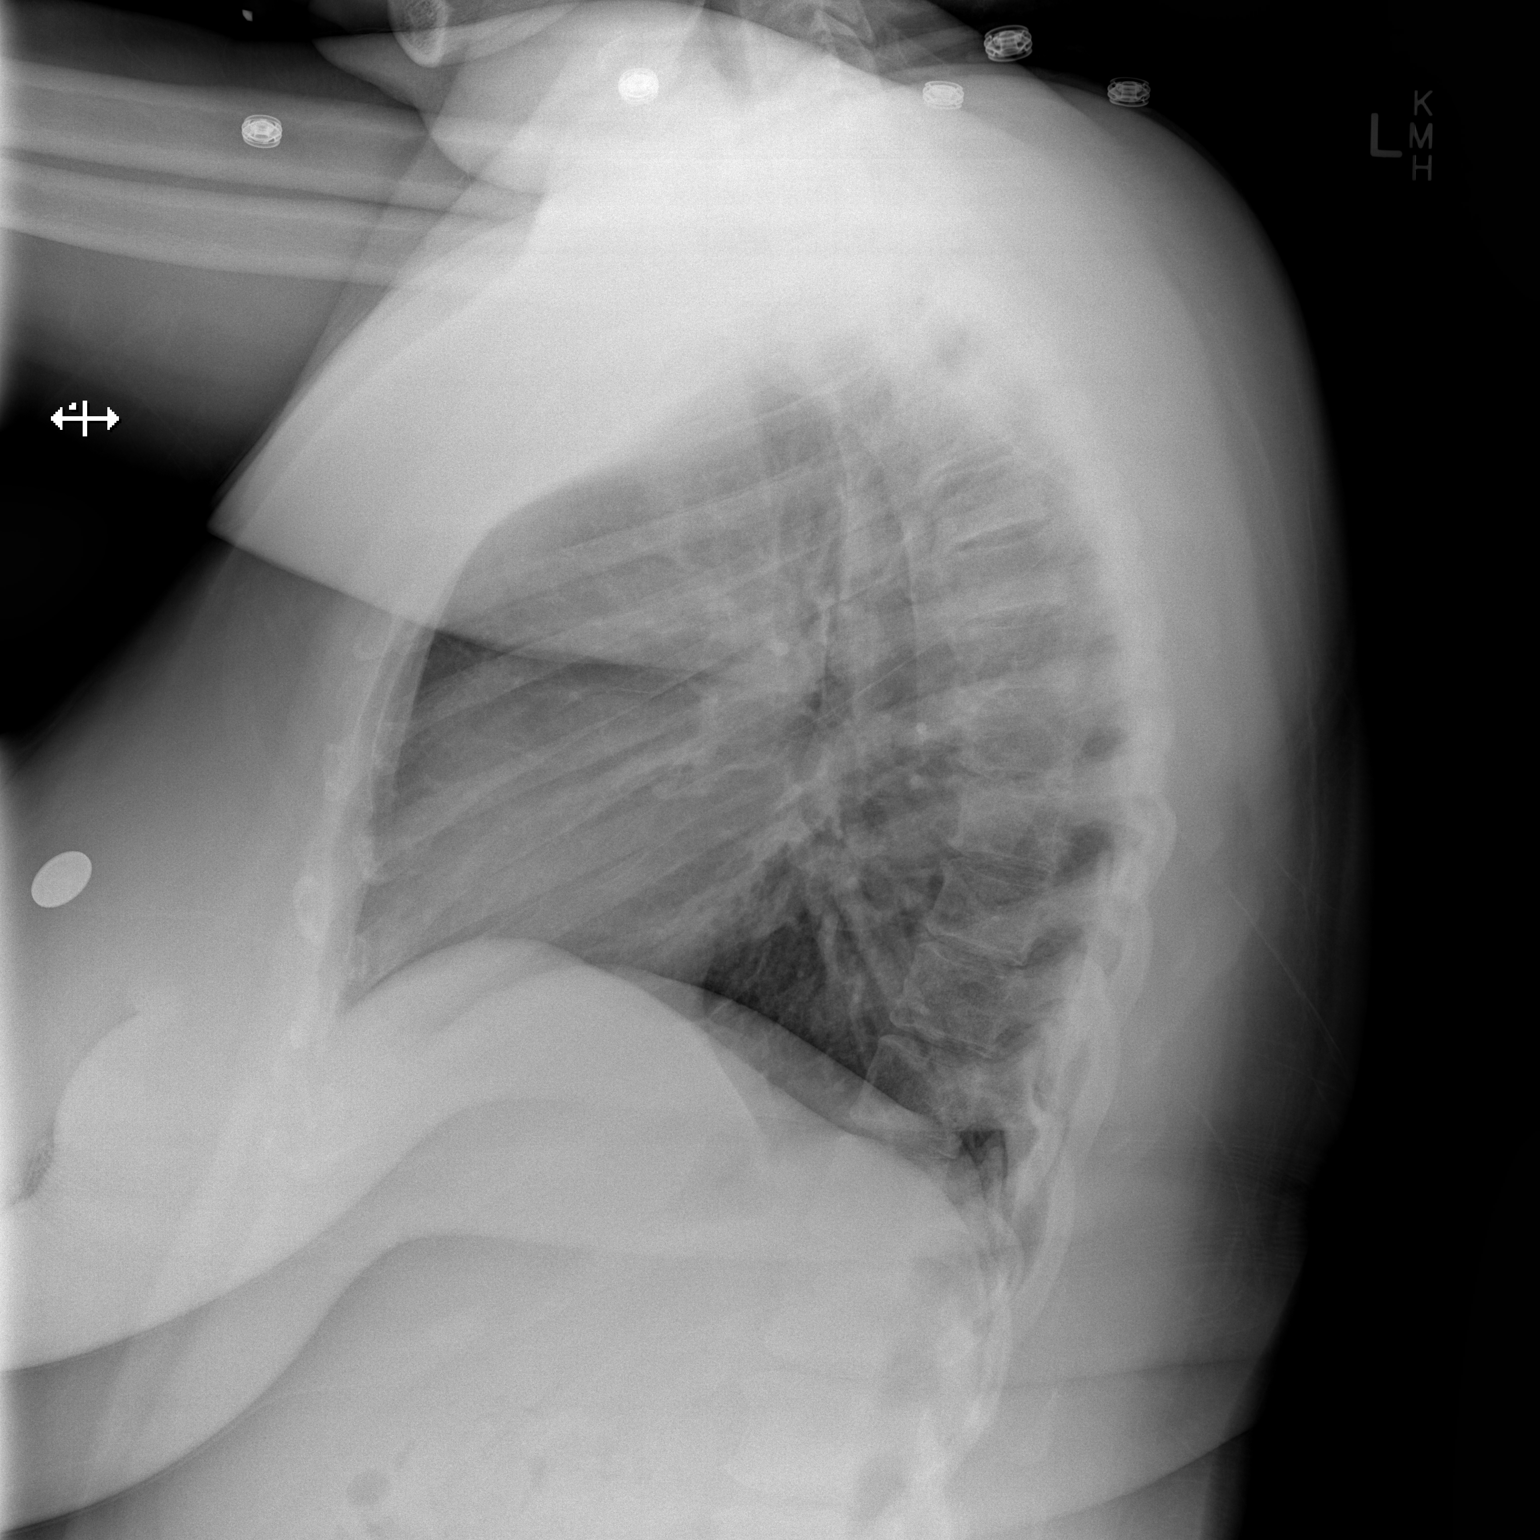

[2 of 2 positions shown; findings below may reference images not displayed]

FINDINGS: Cardiomediastinal silhouette appears normal.  No acute
pulmonary disease is noted.  Bony thorax is intact.
IMPRESSION: No acute cardiopulmonary abnormality seen.

## 2015-01-12 IMAGING — CR DG THORACIC SPINE 2V
2 series · 2 of 2 positions shown · non-contrast
Comparison: None.

CLINICAL DATA: Upper back pain, no injury

EXAM:
THORACIC SPINE - 2 VIEW

[t thoracic spine ap]
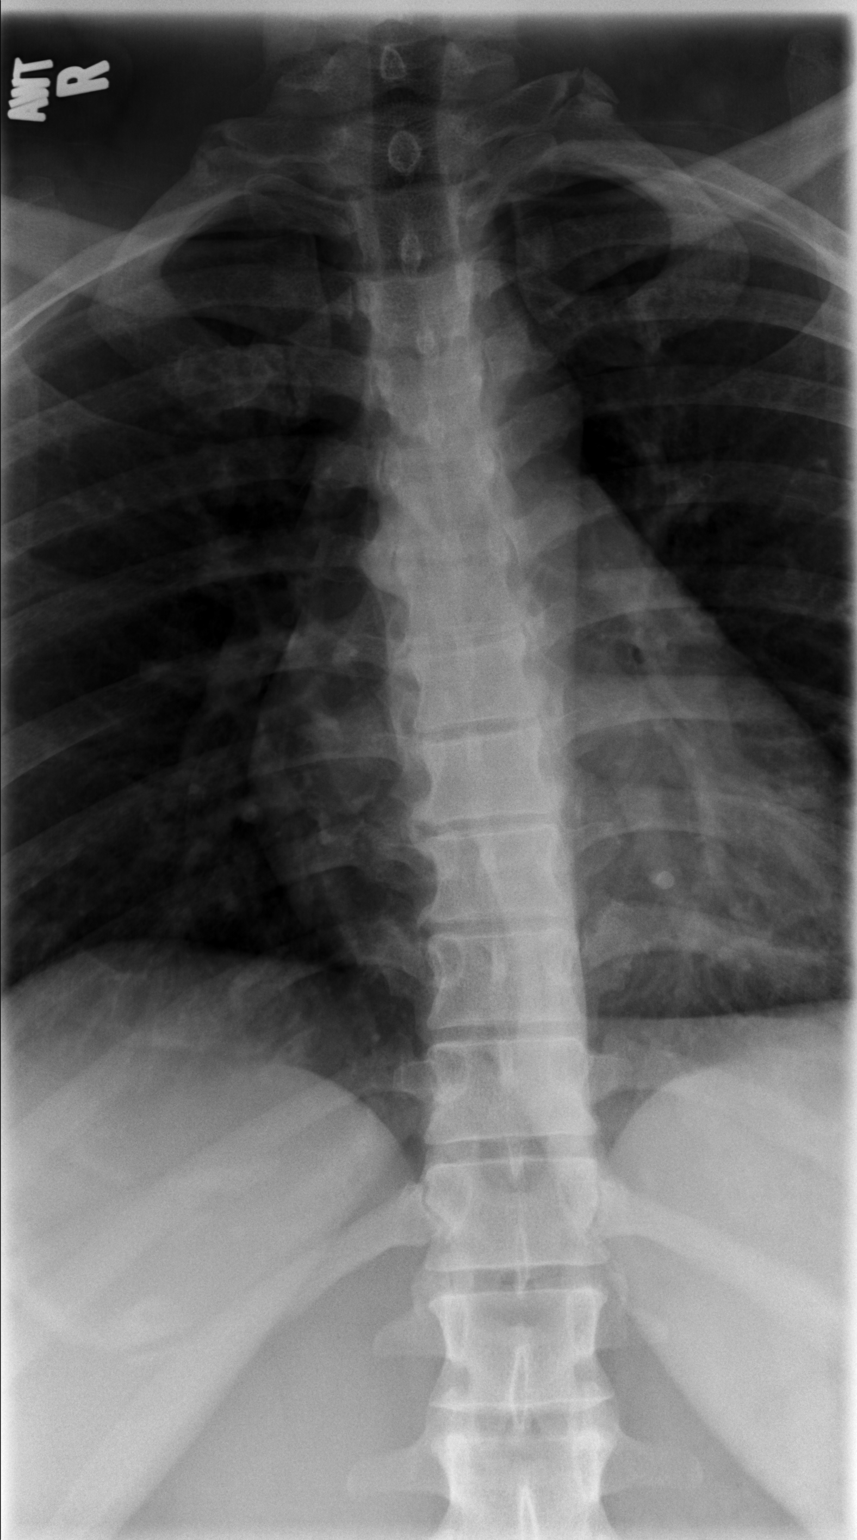

[t thoracic spine lat]
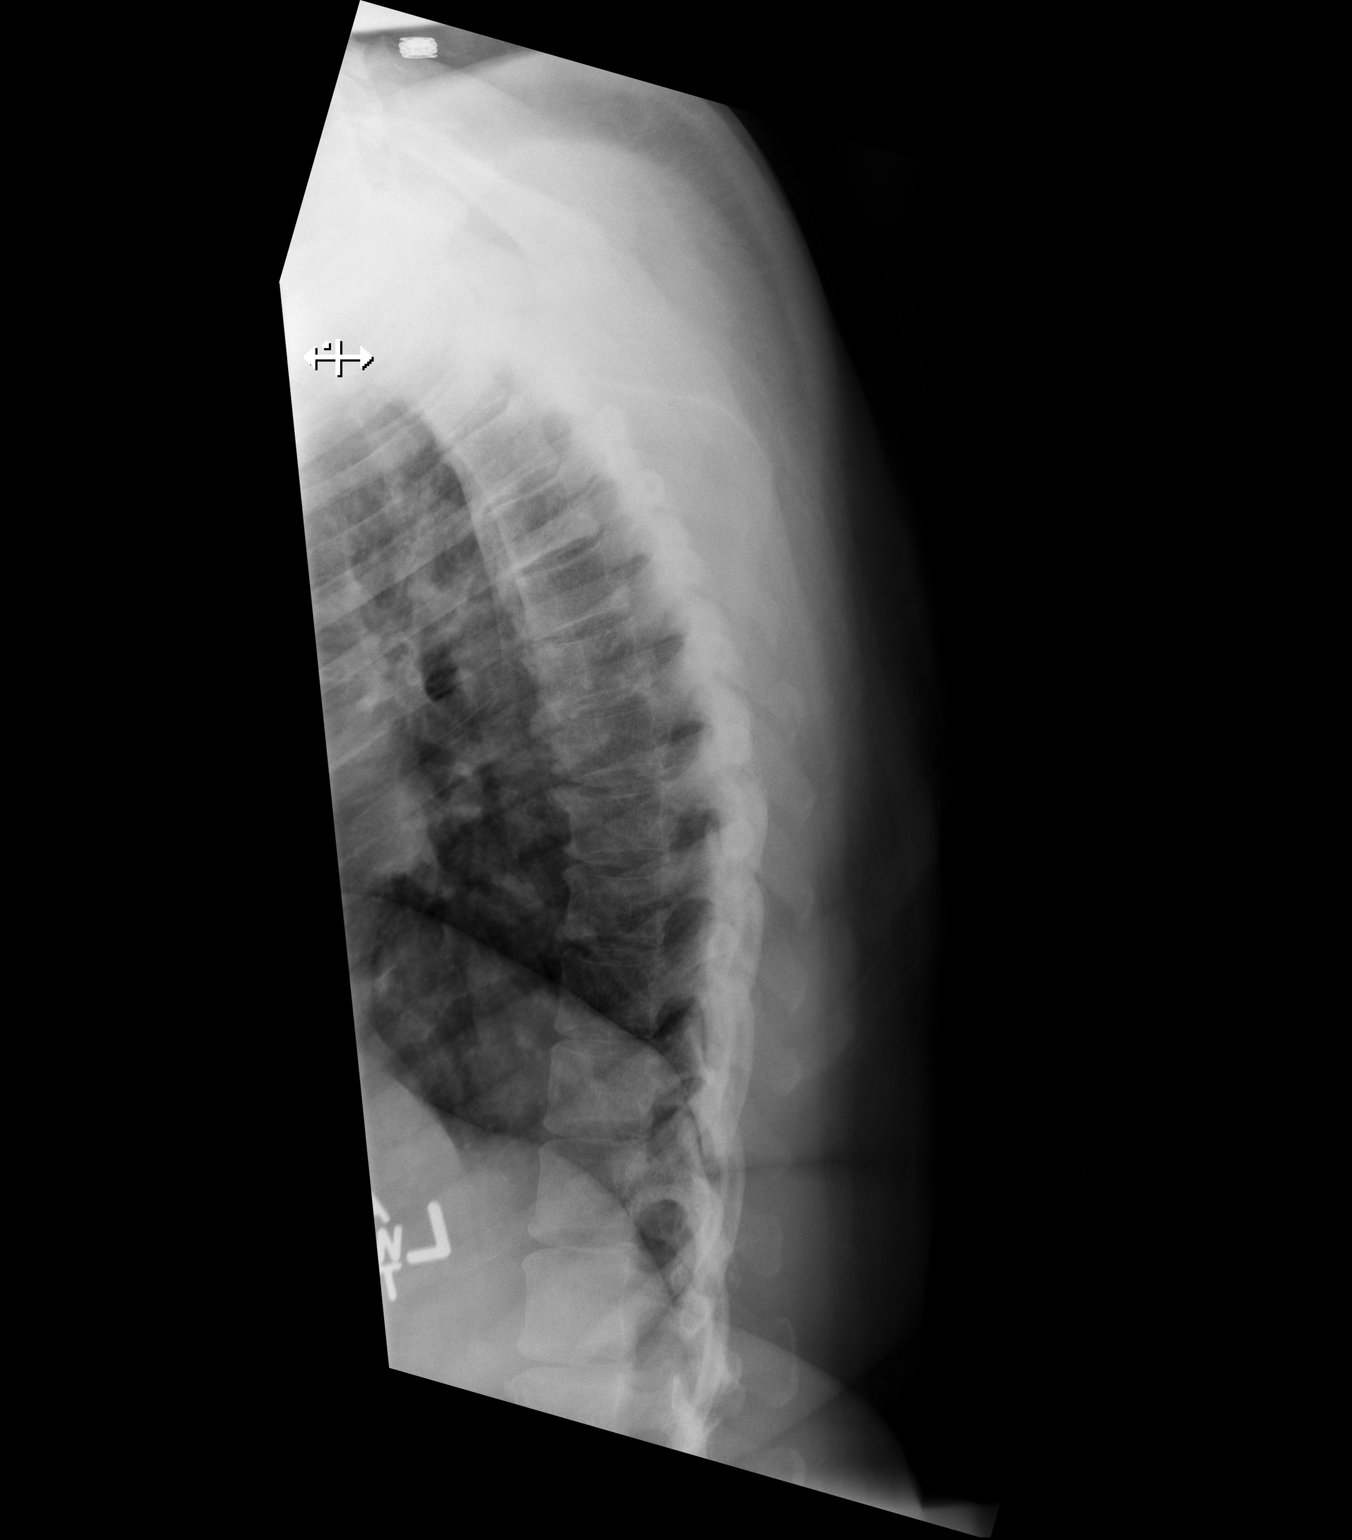

[2 of 2 positions shown; findings below may reference images not displayed]

FINDINGS: There is no evidence of thoracic spine fracture. There is scoliosis
of spine. There is minimal degenerative joint changes of the mid
thoracic spine.
IMPRESSION: No acute fracture or dislocation.
